# Patient Record
Sex: Female | Born: 1975 | Race: White | Hispanic: No | Marital: Married | Smoking: Never smoker
Health system: Southern US, Community
[De-identification: ages and names within clinical notes are randomized; demographics above are authoritative.]

## PROBLEM LIST (undated history)

## (undated) ENCOUNTER — Inpatient Hospital Stay: Admission: EM | Payer: Self-pay | Source: Home / Self Care

## (undated) DIAGNOSIS — E039 Hypothyroidism, unspecified: Secondary | ICD-10-CM

## (undated) DIAGNOSIS — O24419 Gestational diabetes mellitus in pregnancy, unspecified control: Secondary | ICD-10-CM

## (undated) DIAGNOSIS — F419 Anxiety disorder, unspecified: Secondary | ICD-10-CM

## (undated) DIAGNOSIS — D649 Anemia, unspecified: Secondary | ICD-10-CM

## (undated) HISTORY — PX: ABDOMINAL HYSTERECTOMY: SHX81

## (undated) HISTORY — PX: WISDOM TOOTH EXTRACTION: SHX21

## (undated) HISTORY — DX: Anemia, unspecified: D64.9

---

## 2001-03-15 ENCOUNTER — Other Ambulatory Visit: Admission: RE | Admit: 2001-03-15 | Discharge: 2001-03-15 | Payer: Self-pay | Admitting: Obstetrics and Gynecology

## 2001-12-01 ENCOUNTER — Other Ambulatory Visit: Admission: RE | Admit: 2001-12-01 | Discharge: 2001-12-01 | Payer: Self-pay | Admitting: Obstetrics and Gynecology

## 2002-07-11 ENCOUNTER — Inpatient Hospital Stay (HOSPITAL_COMMUNITY): Admission: RE | Admit: 2002-07-11 | Discharge: 2002-07-14 | Payer: Self-pay | Admitting: Obstetrics and Gynecology

## 2007-08-15 ENCOUNTER — Other Ambulatory Visit: Admission: RE | Admit: 2007-08-15 | Discharge: 2007-08-15 | Payer: Self-pay | Admitting: Obstetrics and Gynecology

## 2008-07-12 ENCOUNTER — Ambulatory Visit (HOSPITAL_COMMUNITY): Admission: RE | Admit: 2008-07-12 | Discharge: 2008-07-12 | Payer: Self-pay | Admitting: Family Medicine

## 2008-08-21 ENCOUNTER — Other Ambulatory Visit: Admission: RE | Admit: 2008-08-21 | Discharge: 2008-08-21 | Payer: Self-pay | Admitting: Obstetrics and Gynecology

## 2009-08-26 ENCOUNTER — Other Ambulatory Visit: Admission: RE | Admit: 2009-08-26 | Discharge: 2009-08-26 | Payer: Self-pay | Admitting: Obstetrics and Gynecology

## 2010-09-29 ENCOUNTER — Other Ambulatory Visit: Admission: RE | Admit: 2010-09-29 | Discharge: 2010-09-29 | Payer: Self-pay | Admitting: Obstetrics and Gynecology

## 2011-04-10 NOTE — Op Note (Signed)
   NAME:  Crystal Wiley, Crystal Wiley                          ACCOUNT NO.:  [id number]   MEDICAL RECORD NO.:  [id number]                   PATIENT TYPE:  OIB   LOCATION:  A417                                 FACILITY:  [facility]   PHYSICIAN:  Tilda Burrow, M.D.              DATE OF BIRTH:  [birth year]   DATE OF PROCEDURE:  DATE OF DISCHARGE:                                  PROCEDURE NOTE   DELIVERY NOTE:  The patient was noted to be fully dilated at approximately  0345 with complaints of rectal pressure.  She then began to push and had an  effective second stage.  She delivered a viable female infant at 91  spontaneously.  Mouth and nose were suctioned on the perineum, and the body  delivered easily with a compound right hand.  Apgars were 9 and 9, weight 6  pounds 5 ounces.  The placenta separated spontaneously and was delivered by  a maternal pushing effort and controlled cord traction at 0451.  It was  inspected and appeared to be intact.  The fundus was immediately firm with  very little blood loss.  The vagina was then inspected and a small first  degree laceration was noted, not necessitating repair.  The epidural  catheter was then removed with the blue tip visualized as being intact.  The  mother tolerated the delivery very well.  Estimated blood loss 300 cc.     Crystal Wiley, CNM             Tilda Burrow, M.D.    FC/MEDQ  D:  07/12/2002  T:  07/12/2002  Job:  [id number]

## 2011-04-10 NOTE — H&P (Signed)
NAME:  Crystal Wiley, KHURANA                          ACCOUNT NO.:  [id number]   MEDICAL RECORD NO.:  [id number]                   PATIENT TYPE:  OIB   LOCATION:  A417                                 FACILITY:  [facility]   PHYSICIAN:  Jacklyn Shell, CNM       DATE OF BIRTH:  Jan 20, 1976   DATE OF ADMISSION:  07/11/2002  DATE OF DISCHARGE:                                HISTORY & PHYSICAL   CHIEF COMPLAINT:  Regular uterine contractions since 2100 and spontaneous  rupture of membranes at 2300.   HISTORY OF PRESENT ILLNESS:  The patient is a 35 year old gravida 2, para 0-  0-1-0, with an EDC of July 16, 2002, based on a last menstrual period and  correlating exactly first and second trimester ultrasounds.  She began  prenatal care early in her first trimester and has had regular visits since.   PRENATAL LABORATORY DATA:  Blood type A-negative; she received RhoGAM on  May 02, 2002.  Rubella immune.  HBsAg, HIV, gonorrhea, Chlamydia, MSAFP and  GBS are all negative.  One-hour GTT was normal at 102.  Blood pressures have  been 110-120s/60s-80s.  Fundal height growth has been appropriate for  gestational age.   PAST MEDICAL HISTORY:  Benign.   PAST SURGICAL HISTORY:  Noncontributory.   No history of blood transfusions or anesthesia complications.   ALLERGIES:  No known drug allergies.   SOCIAL HISTORY:  Married.  Works at [employer] and had been working up  until today.   FAMILY HISTORY:  Family history positive for diabetes in a paternal  grandmother.   OBSTETRICAL HISTORY:  Spontaneous Ab, March 1998.   PHYSICAL EXAMINATION:  HEENT:  Within normal limits.  HEART:  Regular rate and rhythm.  LUNGS:  Clear.  ABDOMEN:  Soft and nontender, having regular, moderate-to-palpation uterine  contractions every two to three minutes.  PELVIC:  Cervix is now 5 cm, 100% effaced at 0 station, which is a change  from admission of 3 to 4, 80% and -2 per R.N.  EXTREMITIES:  Legs have  a very trace amount of edema.  DTRs are 3+ without  clonus.  VITAL SIGNS:  Blood pressures now are 140s-150s/high-90s-110s.   IMPRESSION:  Intrauterine pregnancy at 39-1/2 weeks, spontaneous labor with  spontaneous rupture of membranes, active stage, elevated blood pressure,  reassuring fetal status.   PLAN:  The patient is planning to receive an epidural momentarily.  We will  obtain a cath urine specimen once that is placed to rule out proteinuria and  if blood pressures continue to be elevated despite epidural anesthesia, we  will proceed with IV antihypertensives.  Dr. Tilda Burrow is aware of  the admission and will place the epidural.  Jacklyn Shell, CNM    FC/MEDQ  D:  07/12/2002  T:  07/12/2002  Job:  [id number]   cc:   [facility]

## 2012-11-23 NOTE — L&D Delivery Note (Signed)
Delivery Note At 5:30 AM a  viable and healthy female was delivered via Vaginal, Spontaneous Delivery (Presentation: OA).  APGAR:9, 9; weight- pending  Placenta status: spontaneous, intact. Cord: 3 vessel cord. No complications. Cord pH: N/A  Anesthesia: Epidural  Episiotomy: None Lacerations: one supraurethral superficial mucosal laceration, does not need repair Est. Blood Loss (mL): 250 cc  Mom to postpartum.  Baby to Couplet care / Skin to Skin.  Itsel Opfer R 11/18/2013, 5:50 AM

## 2013-04-12 LAB — OB RESULTS CONSOLE ABO/RH: RH Type: NEGATIVE

## 2013-04-12 LAB — OB RESULTS CONSOLE RPR: RPR: NONREACTIVE

## 2013-04-12 LAB — OB RESULTS CONSOLE HIV ANTIBODY (ROUTINE TESTING): HIV: NONREACTIVE

## 2013-04-12 LAB — OB RESULTS CONSOLE HEPATITIS B SURFACE ANTIGEN: Hepatitis B Surface Ag: NEGATIVE

## 2013-04-12 LAB — OB RESULTS CONSOLE RUBELLA ANTIBODY, IGM: Rubella: UNDETERMINED

## 2013-04-12 LAB — OB RESULTS CONSOLE ANTIBODY SCREEN: Antibody Screen: NEGATIVE

## 2013-04-12 LAB — OB RESULTS CONSOLE GBS: GBS: POSITIVE

## 2013-05-08 LAB — OB RESULTS CONSOLE GC/CHLAMYDIA
Chlamydia: NEGATIVE
Gonorrhea: NEGATIVE

## 2013-09-13 ENCOUNTER — Ambulatory Visit: Payer: Self-pay

## 2013-11-07 ENCOUNTER — Telehealth (HOSPITAL_COMMUNITY): Payer: Self-pay | Admitting: *Deleted

## 2013-11-07 ENCOUNTER — Other Ambulatory Visit: Payer: Self-pay | Admitting: Obstetrics

## 2013-11-07 ENCOUNTER — Encounter (HOSPITAL_COMMUNITY): Payer: Self-pay | Admitting: *Deleted

## 2013-11-07 NOTE — Telephone Encounter (Signed)
Preadmission screen  

## 2013-11-18 ENCOUNTER — Inpatient Hospital Stay (HOSPITAL_COMMUNITY)
Admission: AD | Admit: 2013-11-18 | Discharge: 2013-11-20 | DRG: 775 | Disposition: A | Discharge status: Home / Self Care | Payer: PRIVATE HEALTH INSURANCE | Source: Ambulatory Visit | Attending: Obstetrics & Gynecology | Admitting: Obstetrics & Gynecology

## 2013-11-18 ENCOUNTER — Inpatient Hospital Stay (HOSPITAL_COMMUNITY): Payer: PRIVATE HEALTH INSURANCE | Admitting: Anesthesiology

## 2013-11-18 ENCOUNTER — Encounter (HOSPITAL_COMMUNITY): Payer: PRIVATE HEALTH INSURANCE | Admitting: Anesthesiology

## 2013-11-18 ENCOUNTER — Encounter (HOSPITAL_COMMUNITY): Payer: Self-pay | Admitting: *Deleted

## 2013-11-18 DIAGNOSIS — IMO0001 Reserved for inherently not codable concepts without codable children: Secondary | ICD-10-CM

## 2013-11-18 DIAGNOSIS — O99814 Abnormal glucose complicating childbirth: Principal | ICD-10-CM | POA: Diagnosis present

## 2013-11-18 DIAGNOSIS — O09529 Supervision of elderly multigravida, unspecified trimester: Secondary | ICD-10-CM | POA: Diagnosis present

## 2013-11-18 DIAGNOSIS — O99892 Other specified diseases and conditions complicating childbirth: Secondary | ICD-10-CM | POA: Diagnosis present

## 2013-11-18 DIAGNOSIS — O360990 Maternal care for other rhesus isoimmunization, unspecified trimester, not applicable or unspecified: Secondary | ICD-10-CM | POA: Diagnosis present

## 2013-11-18 DIAGNOSIS — Z22330 Carrier of Group B streptococcus: Secondary | ICD-10-CM

## 2013-11-18 HISTORY — DX: Gestational diabetes mellitus in pregnancy, unspecified control: O24.419

## 2013-11-18 LAB — TYPE AND SCREEN
ABO/RH(D): A NEG
Antibody Screen: POSITIVE
DAT, IgG: NEGATIVE

## 2013-11-18 LAB — CBC
HCT: 36.2 % (ref 36.0–46.0)
Hemoglobin: 12.9 g/dL (ref 12.0–15.0)
MCH: 30.5 pg (ref 26.0–34.0)
MCHC: 35.6 g/dL (ref 30.0–36.0)
MCV: 85.6 fL (ref 78.0–100.0)
Platelets: 207 10*3/uL (ref 150–400)
RBC: 4.23 MIL/uL (ref 3.87–5.11)
RDW: 13.4 % (ref 11.5–15.5)
WBC: 14 10*3/uL — ABNORMAL HIGH (ref 4.0–10.5)

## 2013-11-18 LAB — ABO/RH: ABO/RH(D): A NEG

## 2013-11-18 LAB — RPR: RPR Ser Ql: NONREACTIVE

## 2013-11-18 MED ORDER — EPHEDRINE 5 MG/ML INJ
10.0000 mg | INTRAVENOUS | Status: DC | PRN
  Filled 2013-11-18: qty 4
  Filled 2013-11-18: qty 2

## 2013-11-18 MED ORDER — DIBUCAINE 1 % RE OINT: 1.0000 "application " | TOPICAL_OINTMENT | RECTAL | Status: DC | PRN

## 2013-11-18 MED ORDER — BENZOCAINE-MENTHOL 20-0.5 % EX AERO: 1.0000 "application " | INHALATION_SPRAY | CUTANEOUS | Status: DC | PRN

## 2013-11-18 MED ORDER — DIPHENHYDRAMINE HCL 25 MG PO CAPS: 25.0000 mg | ORAL_CAPSULE | Freq: Four times a day (QID) | ORAL | Status: DC | PRN

## 2013-11-18 MED ORDER — CITRIC ACID-SODIUM CITRATE 334-500 MG/5ML PO SOLN: 30.0000 mL | ORAL | Status: DC | PRN

## 2013-11-18 MED ORDER — LIDOCAINE HCL (PF) 1 % IJ SOLN
INTRAMUSCULAR | Status: DC | PRN
  Administered 2013-11-18: 3 mL
  Administered 2013-11-18 (×2): 5 mL

## 2013-11-18 MED ORDER — OXYTOCIN 40 UNITS IN LACTATED RINGERS INFUSION - SIMPLE MED
62.5000 mL/h | INTRAVENOUS | Status: DC
  Filled 2013-11-18: qty 1000

## 2013-11-18 MED ORDER — SIMETHICONE 80 MG PO CHEW: 80.0000 mg | CHEWABLE_TABLET | ORAL | Status: DC | PRN

## 2013-11-18 MED ORDER — IBUPROFEN 600 MG PO TABS
600.0000 mg | ORAL_TABLET | Freq: Four times a day (QID) | ORAL | Status: DC
  Administered 2013-11-18 – 2013-11-20 (×9): 600 mg via ORAL
  Filled 2013-11-18 (×9): qty 1

## 2013-11-18 MED ORDER — PRENATAL MULTIVITAMIN CH
1.0000 | ORAL_TABLET | Freq: Every day | ORAL | Status: DC
  Administered 2013-11-18 – 2013-11-20 (×3): 1 via ORAL
  Filled 2013-11-18 (×3): qty 1

## 2013-11-18 MED ORDER — FENTANYL 2.5 MCG/ML BUPIVACAINE 1/10 % EPIDURAL INFUSION (WH - ANES)
14.0000 mL/h | INTRAMUSCULAR | Status: DC | PRN
  Filled 2013-11-18: qty 125

## 2013-11-18 MED ORDER — FLEET ENEMA 7-19 GM/118ML RE ENEM: 1.0000 | ENEMA | RECTAL | Status: DC | PRN

## 2013-11-18 MED ORDER — ONDANSETRON HCL 4 MG PO TABS: 4.0000 mg | ORAL_TABLET | ORAL | Status: DC | PRN

## 2013-11-18 MED ORDER — OXYCODONE-ACETAMINOPHEN 5-325 MG PO TABS: 1.0000 | ORAL_TABLET | ORAL | Status: DC | PRN

## 2013-11-18 MED ORDER — WITCH HAZEL-GLYCERIN EX PADS: 1.0000 "application " | MEDICATED_PAD | CUTANEOUS | Status: DC | PRN

## 2013-11-18 MED ORDER — PENICILLIN G POTASSIUM 5000000 UNITS IJ SOLR
2.5000 10*6.[IU] | INTRAVENOUS | Status: DC
  Filled 2013-11-18 (×3): qty 2.5

## 2013-11-18 MED ORDER — SENNOSIDES-DOCUSATE SODIUM 8.6-50 MG PO TABS
2.0000 | ORAL_TABLET | ORAL | Status: DC
  Administered 2013-11-18 – 2013-11-20 (×2): 2 via ORAL
  Filled 2013-11-18 (×2): qty 2

## 2013-11-18 MED ORDER — ONDANSETRON HCL 4 MG/2ML IJ SOLN: 4.0000 mg | INTRAMUSCULAR | Status: DC | PRN

## 2013-11-18 MED ORDER — EPHEDRINE 5 MG/ML INJ
10.0000 mg | INTRAVENOUS | Status: DC | PRN
  Filled 2013-11-18: qty 2

## 2013-11-18 MED ORDER — IBUPROFEN 600 MG PO TABS: 600.0000 mg | ORAL_TABLET | Freq: Four times a day (QID) | ORAL | Status: DC | PRN

## 2013-11-18 MED ORDER — PHENYLEPHRINE 40 MCG/ML (10ML) SYRINGE FOR IV PUSH (FOR BLOOD PRESSURE SUPPORT)
80.0000 ug | PREFILLED_SYRINGE | INTRAVENOUS | Status: DC | PRN
  Filled 2013-11-18: qty 2

## 2013-11-18 MED ORDER — LACTATED RINGERS IV SOLN
INTRAVENOUS | Status: DC
  Administered 2013-11-18: 03:00:00 via INTRAVENOUS

## 2013-11-18 MED ORDER — LIDOCAINE HCL (PF) 1 % IJ SOLN
30.0000 mL | INTRAMUSCULAR | Status: DC | PRN
  Filled 2013-11-18 (×2): qty 30

## 2013-11-18 MED ORDER — FENTANYL 2.5 MCG/ML BUPIVACAINE 1/10 % EPIDURAL INFUSION (WH - ANES)
INTRAMUSCULAR | Status: DC | PRN
  Administered 2013-11-18: 14 mL/h via EPIDURAL

## 2013-11-18 MED ORDER — DIPHENHYDRAMINE HCL 50 MG/ML IJ SOLN: 12.5000 mg | INTRAMUSCULAR | Status: DC | PRN

## 2013-11-18 MED ORDER — LACTATED RINGERS IV SOLN
500.0000 mL | Freq: Once | INTRAVENOUS | Status: AC
  Administered 2013-11-18: 500 mL via INTRAVENOUS

## 2013-11-18 MED ORDER — LANOLIN HYDROUS EX OINT: TOPICAL_OINTMENT | CUTANEOUS | Status: DC | PRN

## 2013-11-18 MED ORDER — PENICILLIN G POTASSIUM 5000000 UNITS IJ SOLR
5.0000 10*6.[IU] | Freq: Once | INTRAVENOUS | Status: AC
  Administered 2013-11-18: 5 10*6.[IU] via INTRAVENOUS
  Filled 2013-11-18: qty 5

## 2013-11-18 MED ORDER — ONDANSETRON HCL 4 MG/2ML IJ SOLN
4.0000 mg | Freq: Four times a day (QID) | INTRAMUSCULAR | Status: DC | PRN
  Administered 2013-11-18: 4 mg via INTRAVENOUS
  Filled 2013-11-18: qty 2

## 2013-11-18 MED ORDER — LACTATED RINGERS IV SOLN: 500.0000 mL | INTRAVENOUS | Status: DC | PRN

## 2013-11-18 MED ORDER — ACETAMINOPHEN 325 MG PO TABS: 650.0000 mg | ORAL_TABLET | ORAL | Status: DC | PRN

## 2013-11-18 MED ORDER — OXYTOCIN BOLUS FROM INFUSION
500.0000 mL | INTRAVENOUS | Status: DC
  Administered 2013-11-18: 500 mL via INTRAVENOUS

## 2013-11-18 MED ORDER — PHENYLEPHRINE 40 MCG/ML (10ML) SYRINGE FOR IV PUSH (FOR BLOOD PRESSURE SUPPORT)
80.0000 ug | PREFILLED_SYRINGE | INTRAVENOUS | Status: DC | PRN
  Filled 2013-11-18: qty 2
  Filled 2013-11-18: qty 10

## 2013-11-18 MED ORDER — ZOLPIDEM TARTRATE 5 MG PO TABS: 5.0000 mg | ORAL_TABLET | Freq: Every evening | ORAL | Status: DC | PRN

## 2013-11-18 NOTE — Anesthesia Preprocedure Evaluation (Signed)
Anesthesia Evaluation  Patient identified by MRN, date of birth, ID band Patient awake    Reviewed: Allergy & Precautions, H&P , NPO status , Patient's Chart, lab work & pertinent test results  History of Anesthesia Complications Negative for: history of anesthetic complications  Airway Mallampati: II TM Distance: >3 FB Neck ROM: full    Dental no notable dental hx. (+) Teeth Intact   Pulmonary neg pulmonary ROS,  breath sounds clear to auscultation  Pulmonary exam normal       Cardiovascular negative cardio ROS  Rhythm:regular Rate:Normal     Neuro/Psych negative neurological ROS  negative psych ROS   GI/Hepatic negative GI ROS, Neg liver ROS,   Endo/Other  negative endocrine ROSdiabetes, Gestational  Renal/GU negative Renal ROS  negative genitourinary   Musculoskeletal   Abdominal Normal abdominal exam  (+)   Peds  Hematology negative hematology ROS (+)   Anesthesia Other Findings   Reproductive/Obstetrics (+) Pregnancy                           Anesthesia Physical Anesthesia Plan  ASA: II  Anesthesia Plan: Epidural   Post-op Pain Management:    Induction:   Airway Management Planned:   Additional Equipment:   Intra-op Plan:   Post-operative Plan:   Informed Consent: I have reviewed the patients History and Physical, chart, labs and discussed the procedure including the risks, benefits and alternatives for the proposed anesthesia with the patient or authorized representative who has indicated his/her understanding and acceptance.     Plan Discussed with:   Anesthesia Plan Comments:         Anesthesia Quick Evaluation  

## 2013-11-18 NOTE — Progress Notes (Signed)
Patient ID: Crystal Wiley, female   DOB: 1976-04-21, 37 y.o.   MRN: [id number] INTERVAL NOTE:  S:   Sitting in bed, BFing, min cramping, (+) voids, small bleed, denies HA/NV/dizziness  O:   VSS, AAO x 3, NAD  Fundus @ U  Scant lochia  A / P:   PPD #0  Stable post partum  Routine PP orders  Desires early discharge tomorrow  Raelyn Mora, M, MSN, CNM 11/18/2013, 10:40 AM

## 2013-11-18 NOTE — Anesthesia Procedure Notes (Signed)
Epidural Patient location during procedure: OB  Staffing Anesthesiologist: Jeremiah Tarpley Performed by: anesthesiologist   Preanesthetic Checklist Completed: patient identified, site marked, surgical consent, pre-op evaluation, timeout performed, IV checked, risks and benefits discussed and monitors and equipment checked  Epidural Patient position: sitting Prep: ChloraPrep Patient monitoring: heart rate, continuous pulse ox and blood pressure Approach: right paramedian Injection technique: LOR saline  Needle:  Needle type: Tuohy  Needle gauge: 17 G Needle length: 9 cm and 9 Needle insertion depth: 6 cm Catheter type: closed end flexible Catheter size: 20 Guage Catheter at skin depth: 11 cm Test dose: negative  Assessment Events: blood not aspirated, injection not painful, no injection resistance, negative IV test and no paresthesia  Additional Notes   Patient tolerated the insertion well without complications.   

## 2013-11-18 NOTE — MAU Note (Signed)
Patient reports contractions every 5-7 minutes since 10 pm and some mucous mixed with blood. Good fetal movement.

## 2013-11-18 NOTE — H&P (Signed)
Crystal Wiley is a 37 y.o. female presenting in active labor at 39.5 wks. G3P1011, AMA, spontaneous pregnancy, GBS, A1GDM with good BS control, Rh negative [facility] [place], Dr Algie Coffer. Uncomplicated course except 1hr Glucola was 211, since then checking diet controlled with normal BS. Took TDap.   Maternal Medical History:  Reason for admission: Contractions.     OB History   Grav Para Term Preterm Abortions TAB SAB Ect Mult Living   3 1 1  1  1   1      Past Medical History  Diagnosis Date  . Gestational diabetes     Diet controlled   Past Surgical History  Procedure Laterality Date  . Wisdom tooth extraction     Family History: family history includes Cancer in her paternal aunt and paternal grandmother; Diabetes in her paternal grandmother. Social History:  reports that she has never smoked. She has never used smokeless tobacco. She reports that she does not drink alcohol or use illicit drugs.   Prenatal Transfer Tool  Maternal Diabetes: Yes:  Diabetes Type:  Diet controlled Genetic Screening: Normal Ultrascreen and AFP1 normal Maternal Ultrasounds/Referrals: Normal Fetal Ultrasounds or other Referrals:  None Maternal Substance Abuse:  No Significant Maternal Medications:  None Significant Maternal Lab Results:  Lab values include: Group B Strep positive, Rh negative, s/p Rhogam Other Comments:  None  ROS neg  Dilation: 8 Effacement (%): 90 Station: 0 Exam by:: Crystal Wiley Blood pressure 127/76, pulse 122, temperature 99 F (37.2 C), temperature source Oral, resp. rate 20, height 5\' 3"  (1.6 m), weight 190 lb (86.183 kg), last menstrual period 02/13/2013, SpO2 100.00%. Exam Physical Exam   A&O x 3, no acute distress. Pleasant HEENT neg, no thyromegaly Lungs CTA bilat CV RRR, S1S2 normal Abdo soft, non tender, non acute Extr no edema/ tenderness Pelvic - above FHT 135/ + accels./ no decels/ mod variab- reassuring- category I Toco q 5 min.   Prenatal labs: ABO,  Rh: A/Negative/-- (05/21 0000) Antibody: Negative (05/21 0000) Rubella: Equivocal (05/21 0000) RPR: Nonreactive (05/21 0000)  HBsAg: Negative (05/21 0000)  HIV: Non-reactive (05/21 0000)  GBS: Positive (05/21 0000)  Anatomy normal Ultrascreen neg, AFP 1 normal  Assessment/Plan: 37 yo, G3P1011, at 39.5 wks with active labor. GBS(+), on PCN per protocol and s/p Epidural, progressing well. FHT category I  Crystal Wiley 11/18/2013, 4:36 AM

## 2013-11-18 NOTE — Anesthesia Postprocedure Evaluation (Signed)
Anesthesia Post Note  Patient: Crystal Wiley  Procedure(s) Performed: * No procedures listed *  Anesthesia type: Epidural  Patient location: Mother/Baby  Post pain: Pain level controlled  Post assessment: Post-op Vital signs reviewed  Last Vitals:  Filed Vitals:   11/18/13 1747  BP: 145/80  Pulse: 85  Temp: 36.4 C  Resp: 20    Post vital signs: Reviewed  Level of consciousness:alert  Complications: No apparent anesthesia complications

## 2013-11-19 LAB — CBC
HCT: 32.7 % — ABNORMAL LOW (ref 36.0–46.0)
Hemoglobin: 11.1 g/dL — ABNORMAL LOW (ref 12.0–15.0)
MCH: 29.4 pg (ref 26.0–34.0)
MCHC: 33.9 g/dL (ref 30.0–36.0)
MCV: 86.5 fL (ref 78.0–100.0)
Platelets: 171 10*3/uL (ref 150–400)
RBC: 3.78 MIL/uL — ABNORMAL LOW (ref 3.87–5.11)
RDW: 13.8 % (ref 11.5–15.5)
WBC: 10.9 10*3/uL — ABNORMAL HIGH (ref 4.0–10.5)

## 2013-11-19 MED ORDER — MEASLES, MUMPS & RUBELLA VAC ~~LOC~~ INJ: 0.5000 mL | INJECTION | Freq: Once | SUBCUTANEOUS | Status: DC

## 2013-11-19 NOTE — Plan of Care (Signed)
Problem: Discharge Progression Outcomes Goal: MMR given as ordered Outcome: Not Met (add Reason) Pt refused rubella vaccine when educated about non-immune status and possibility of getting [place] measles with any further pregnancies, that this can harm the baby.

## 2013-11-19 NOTE — Progress Notes (Signed)
Pt is rubella non-immune was offerred Rubella vaccine, refused vaccine. Was educated as to risks of contracting rubella when pregnant as to the harm it will cause the baby.

## 2013-11-19 NOTE — Progress Notes (Addendum)
Mother found asleep  And holding infant. Mother had been told earlier in shift to not sleep with infant due to possibly injuring infant. Mother verbalized understanding information given. Mother also requested infant testing be delayed to allow infant time to "wake up".

## 2013-11-19 NOTE — Progress Notes (Signed)
Patient ID: Crystal Wiley, female   DOB: [birth year], 37 y.o.   MRN: [id number] PPD # 1 SVD  S:  Reports feeling well             Tolerating po/ No nausea or vomiting             Bleeding is light             Pain controlled with ibuprofen (OTC)             Up ad lib / ambulatory / voiding without difficulties    Newborn  Information for the patient's newborn:  Jaina, Morin [[id number]]  female  breast feeding  / Circumcision done   O:  A & O x 3, in no apparent distress              VS:  Filed Vitals:   11/18/13 0900 11/18/13 1300 11/18/13 1747 11/19/13 0540  BP: 131/83 121/84 145/80 132/87  Pulse: 85 82 85 91  Temp: 97.7 F (36.5 C) 97.4 F (36.3 C) 97.5 F (36.4 C) 98.5 F (36.9 C)  TempSrc: Oral Oral Oral Oral  Resp: 18 18 20 18   Height:      Weight:      SpO2:        LABS:  Recent Labs  11/18/13 0227 11/19/13 0549  WBC 14.0* 10.9*  HGB 12.9 11.1*  HCT 36.2 32.7*  PLT 207 171    Blood type: --/--/A NEG, A NEG (12/27 0227)  Rubella: Equivocal (05/21 0000)   I&O: I/O last 3 completed shifts: In: -  Out: 1100 [Urine:850; Blood:250]             Lungs: Clear and unlabored  Heart: regular rate and rhythm / no murmurs  Abdomen: soft, non-tender, non-distended              Fundus: firm, non-tender, U-2  Perineum: healing well  Lochia: minimal  Extremities: no edema, no calf pain or tenderness, no Homans    A/P: PPD # 1  37 y.o., [id number]   Principal Problem:   SVD (spontaneous vaginal delivery) (12/27)   Doing well - stable status  Routine post partum orders  Anticipate discharge tomorrow    Raelyn Mora, M, MSN, CNM 11/19/2013, 10:55 AM

## 2013-11-20 ENCOUNTER — Inpatient Hospital Stay (HOSPITAL_COMMUNITY): Admission: RE | Admit: 2013-11-20 | Payer: PRIVATE HEALTH INSURANCE | Source: Ambulatory Visit

## 2013-11-20 MED ORDER — IBUPROFEN 600 MG PO TABS: 600.0000 mg | ORAL_TABLET | Freq: Four times a day (QID) | ORAL | Status: DC

## 2013-11-20 NOTE — Discharge Summary (Signed)
Obstetric Discharge Summary  Reason for Admission: onset of labor- active labor / 39.5 weeks with GDMa1 Prenatal Procedures: ultrasound Intrapartum Procedures: GBS prophylaxis with PCN / epidural /  spontaneous vaginal delivery Postpartum Procedures: none -Rhogam at discharge / declined MMR booster Complications-Operative and Postpartum: none Hemoglobin  Date Value Range Status  11/19/2013 11.1* 12.0 - 15.0 g/dL Final     HCT  Date Value Range Status  11/19/2013 32.7* 36.0 - 46.0 % Final    Physical Exam:  General: alert, cooperative and no distress Lochia: appropriate Uterine Fundus: firm DVT Evaluation: No evidence of DVT seen on physical exam.  Discharge Diagnoses: Term Pregnancy-delivered / GDMa1 - delivered  Discharge Information: Date: 11/20/2013 Activity: pelvic rest Diet: routine Medications: PNV and Ibuprofen Condition: stable Instructions: refer to practice specific booklet Discharge to: home Follow-up Information   Follow up with [facility] A[facility] MD. Schedule an appointment as soon as possible for a visit in 6 weeks.   Specialty:  Obstetrics and Gynecology   Contact information:   [address] [place] [address] [phone]       Newborn Data: Live born female  Birth Weight: 7 lb 8.4 oz (3413 g) APGAR: 9, 9  Home with mother.  Crystal Wiley 11/20/2013, 10:12 AM

## 2013-11-20 NOTE — Progress Notes (Signed)
PPD 2 SVD  S:  Reports feeling well - ready to go home             Tolerating po/ No nausea or vomiting             Bleeding is light             Pain controlled with motrin only             Up ad lib / ambulatory / voiding QS  Newborn breast feeding  / circumcision planned prior to DC - awaiting Peds visit this am as well  O:               VS: BP 125/89  Pulse 89  Temp(Src) 98.6 F (37 C) (Oral)  Resp 18  Ht 5\' 3"  (1.6 m)  Wt 86.183 kg (190 lb)  BMI 33.67 kg/m2  SpO2 98%  LMP 02/13/2013   LABS:              Recent Labs  11/18/13 0227 11/19/13 0549  WBC 14.0* 10.9*  HGB 12.9 11.1*  PLT 207 171               Blood type: --/--/A NEG, A NEG (12/27 0227)  Rubella: Equivocal (05/21 0000)      - declined MMR booster (flag office chart)                  Physical Exam:             Alert and oriented X3  Abdomen: soft, non-tender, non-distended              Fundus: firm, non-tender, U-1  Perineum: no edema  Lochia: light  Extremities: no edema, no calf pain or tenderness    A: PPD # 2   Doing well - stable status  P: Routine post partum orders  Dc home today   Marlinda Mike CNM, MSN, [facility] 11/20/2013, 9:38 AM

## 2014-04-18 ENCOUNTER — Encounter: Payer: Self-pay | Admitting: Nurse Practitioner

## 2014-04-18 ENCOUNTER — Ambulatory Visit (INDEPENDENT_AMBULATORY_CARE_PROVIDER_SITE_OTHER): Payer: PRIVATE HEALTH INSURANCE | Admitting: Nurse Practitioner

## 2014-04-18 VITALS — BP 118/82 | Temp 100.0°F | Ht 63.0 in | Wt 184.6 lb

## 2014-04-18 DIAGNOSIS — B348 Other viral infections of unspecified site: Secondary | ICD-10-CM

## 2014-04-18 DIAGNOSIS — B338 Other specified viral diseases: Secondary | ICD-10-CM

## 2014-04-18 MED ORDER — AZITHROMYCIN 250 MG PO TABS: ORAL_TABLET | ORAL | Status: DC

## 2014-04-18 MED ORDER — ALBUTEROL SULFATE HFA 108 (90 BASE) MCG/ACT IN AERS: 2.0000 | INHALATION_SPRAY | RESPIRATORY_TRACT | Status: DC | PRN

## 2014-04-21 ENCOUNTER — Encounter: Payer: Self-pay | Admitting: Nurse Practitioner

## 2014-04-21 NOTE — Progress Notes (Signed)
Subjective:  Presents for c/o fever, fatigue, body aches, generalized headache, cough, head congestion and sore throat that began yesterday. Max temp 100.9. No vomiting, diarrhea or abd pain. No ear pain. Taking fluids well. Voiding nl. Is NOT breastfeeding.  Objective:   BP 118/82  Temp(Src) 100 F (37.8 C) (Oral)  Ht 5\' 3"  (1.6 m)  Wt 184 lb 9.6 oz (83.734 kg)  BMI 32.71 kg/m2 NAD. Alert, oriented. Fatigued in appearance. TMs clear effusion. Pharynx clear and moist. Neck supple with mild adenopathy. Lungs clear. Heart RRR.   Assessment: Parainfluenza  Plan:  Meds ordered this encounter  Medications  . azithromycin (ZITHROMAX Z-PAK) 250 MG tablet    Sig: Take 2 tablets (500 mg) on  Day 1,  followed by 1 tablet (250 mg) once daily on Days 2 through 5.    Dispense:  6 each    Refill:  0    Order Specific Question:  Supervising Provider    Answer:  Merlyn Albert [[id number]]  . albuterol (PROVENTIL HFA;VENTOLIN HFA) 108 (90 BASE) MCG/ACT inhaler    Sig: Inhale 2 puffs into the lungs every 4 (four) hours as needed for wheezing or shortness of breath.    Dispense:  1 Inhaler    Refill:  0    Order Specific Question:  Supervising Provider    Answer:  Riccardo Dubin   Start antibiotic in 2-3 days if needed. Call back if wheezing worsens or persists. Warning signs and symptomatic care reviewed.

## 2014-09-24 ENCOUNTER — Encounter: Payer: Self-pay | Admitting: Nurse Practitioner

## 2015-02-28 ENCOUNTER — Other Ambulatory Visit (HOSPITAL_COMMUNITY): Payer: Self-pay | Admitting: "Endocrinology

## 2015-02-28 DIAGNOSIS — E049 Nontoxic goiter, unspecified: Secondary | ICD-10-CM

## 2015-03-07 ENCOUNTER — Ambulatory Visit (HOSPITAL_COMMUNITY)
Admission: RE | Admit: 2015-03-07 | Discharge: 2015-03-07 | Disposition: A | Discharge status: Home / Self Care | Payer: Commercial Managed Care - PPO | Source: Ambulatory Visit | Attending: "Endocrinology | Admitting: "Endocrinology

## 2015-03-07 DIAGNOSIS — E039 Hypothyroidism, unspecified: Secondary | ICD-10-CM | POA: Diagnosis not present

## 2015-03-07 DIAGNOSIS — E049 Nontoxic goiter, unspecified: Secondary | ICD-10-CM

## 2015-08-21 ENCOUNTER — Other Ambulatory Visit: Payer: Self-pay | Admitting: "Endocrinology

## 2015-09-02 LAB — TSH
TSH: 0.31 u[IU]/mL — AB (ref <=5.90)
TSH: 0.31 u[IU]/mL — AB (ref <=5.90)

## 2015-09-02 LAB — HEMOGLOBIN A1C: Hgb A1c MFr Bld: 6 % (ref 4.0–6.0)

## 2015-09-10 ENCOUNTER — Ambulatory Visit (INDEPENDENT_AMBULATORY_CARE_PROVIDER_SITE_OTHER): Payer: Commercial Managed Care - PPO | Admitting: "Endocrinology

## 2015-09-10 ENCOUNTER — Encounter: Payer: Self-pay | Admitting: "Endocrinology

## 2015-09-10 VITALS — BP 110/81 | HR 78 | Ht 63.0 in | Wt 176.0 lb

## 2015-09-10 DIAGNOSIS — E039 Hypothyroidism, unspecified: Secondary | ICD-10-CM | POA: Diagnosis not present

## 2015-09-10 DIAGNOSIS — E6609 Other obesity due to excess calories: Secondary | ICD-10-CM | POA: Diagnosis not present

## 2015-09-10 DIAGNOSIS — R7302 Impaired glucose tolerance (oral): Secondary | ICD-10-CM | POA: Diagnosis not present

## 2015-09-10 DIAGNOSIS — R7309 Other abnormal glucose: Secondary | ICD-10-CM

## 2015-09-10 DIAGNOSIS — R7303 Prediabetes: Secondary | ICD-10-CM | POA: Insufficient documentation

## 2015-09-10 MED ORDER — LEVOTHYROXINE SODIUM 88 MCG PO TABS: 88.0000 ug | ORAL_TABLET | Freq: Every day | ORAL | Status: DC

## 2015-09-10 MED ORDER — PHENTERMINE-TOPIRAMATE ER 7.5-46 MG PO CP24: 1.0000 | ORAL_CAPSULE | Freq: Every day | ORAL | Status: DC

## 2015-09-10 MED ORDER — METFORMIN HCL 500 MG PO TABS: 500.0000 mg | ORAL_TABLET | Freq: Two times a day (BID) | ORAL | Status: DC

## 2015-09-10 NOTE — Progress Notes (Addendum)
Subjective:    Patient ID: Crystal Wiley, female    DOB: 1976/02/16,    Past Medical History  Diagnosis Date  . Gestational diabetes     Diet controlled  . SVD (spontaneous vaginal delivery) 11/18/2013  . SVD (spontaneous vaginal delivery) (12/27) 11/18/2013   Past Surgical History  Procedure Laterality Date  . Wisdom tooth extraction     Social History   Social History  . Marital Status: Married    Spouse Name: N/A  . Number of Children: N/A  . Years of Education: N/A   Social History Main Topics  . Smoking status: Never Smoker   . Smokeless tobacco: Never Used  . Alcohol Use: No  . Drug Use: No  . Sexual Activity: Yes   Other Topics Concern  . None   Social History Narrative   Outpatient Encounter Prescriptions as of 09/10/2015  Medication Sig  . levothyroxine (SYNTHROID, LEVOTHROID) 88 MCG tablet Take 1 tablet (88 mcg total) by mouth daily before breakfast.  . metFORMIN (GLUCOPHAGE) 500 MG tablet Take 1 tablet (500 mg total) by mouth 2 (two) times daily with a meal.  . Phentermine-Topiramate (QSYMIA) 7.5-46 MG CP24 Take 1 capsule by mouth daily.  . [DISCONTINUED] levothyroxine (SYNTHROID, LEVOTHROID) 75 MCG tablet Take 75 mcg by mouth daily before breakfast.  . [DISCONTINUED] metFORMIN (GLUCOPHAGE) 500 MG tablet Take by mouth 2 (two) times daily with a meal.  . [DISCONTINUED] Phentermine-Topiramate (QSYMIA) 7.5-46 MG CP24 Take by mouth daily.  . [DISCONTINUED] albuterol (PROVENTIL HFA;VENTOLIN HFA) 108 (90 BASE) MCG/ACT inhaler Inhale 2 puffs into the lungs every 4 (four) hours as needed for wheezing or shortness of breath.  . [DISCONTINUED] azithromycin (ZITHROMAX Z-PAK) 250 MG tablet Take 2 tablets (500 mg) on  Day 1,  followed by 1 tablet (250 mg) once daily on Days 2 through 5.  . [DISCONTINUED] ibuprofen (ADVIL,MOTRIN) 600 MG tablet Take 1 tablet (600 mg total) by mouth every 6 (six) hours.  . [DISCONTINUED] levothyroxine (SYNTHROID, LEVOTHROID) 88 MCG  tablet TAKE ONE TABLET BY MOUTH ONCE DAILY IN THE MORNING.  . [DISCONTINUED] Prenatal Vit-Fe Fumarate-FA (PRENATAL MULTIVITAMIN) TABS tablet Take 1 tablet by mouth daily at 12 noon.   No facility-administered encounter medications on file as of 09/10/2015.   ALLERGIES: No Known Allergies VACCINATION STATUS: Immunization History  Administered Date(s) Administered  . Influenza Split 08/24/2013  . Tdap 08/24/2013    HPI  Crystal Wiley is a 39-yr -old female patient with medical hx a s above. she is here to f/u with repeat TFTs, and a1c. he denies dysphagia, SOB, nor voice change. She feels better, has more energy, lost 13 pounds. She denies cold intolerance heat intolerance. She has not been consistent with metformin. she was found to have clinical goiter and recent thyroid ultrasound confirmed mild goiter.  she has had hx of GDM about a year ago.  Review of Systems  Constitutional: no weight gain/loss, no fatigue, no subjective hyperthermia/hypothermia Eyes: no blurry vision, no xerophthalmia ENT: no sore throat, no nodules palpated in throat, no dysphagia/odynophagia, no hoarseness Cardiovascular: no CP/SOB/palpitations/leg swelling Respiratory: no cough/SOB Gastrointestinal: no N/V/D/C Musculoskeletal: no muscle/joint aches Skin: no rashes Neurological: no tremors/numbness/tingling/dizziness Psychiatric: no depression/anxiety   Objective:    BP 110/81 mmHg  Pulse 78  Ht  (1.6 m)  Wt 176 lb (79.833 kg)  BMI 31.18 kg/m2  SpO2 100%  Wt Readings from Last 3 Encounters:  09/10/15 176 lb (79.833 kg)  04/18/14 184 lb 9.6  oz (83.734 kg)  11/18/13 190 lb (86.183 kg)    Physical Exam  Constitutional: overweight, in NAD Eyes: PERRLA, EOMI, no exophthalmos ENT: moist mucous membranes, no thyromegaly, no cervical lymphadenopathy Cardiovascular: RRR, No MRG Respiratory: CTA B Gastrointestinal: abdomen soft, NT, ND, BS+ Musculoskeletal: no deformities, strength intact in  all 4 Skin: moist, warm, no rashes Neurological: no tremor with outstretched hands, DTR normal in all 4  Results for orders placed or performed in visit on 09/10/15  Hemoglobin A1c  Result Value Ref Range   Hgb A1c MFr Bld 6.0 4.0 - 6.0 %  TSH  Result Value Ref Range   TSH 0.31 (A) .41 - 5.90 uIU/mL  TSH  Result Value Ref Range   TSH 0.31 (A) .41 - 5.90 uIU/mL   associated free T4 is 1.43 from 09/02/2015 (normal 0.82-1.77) Complete Blood Count (Most recent): Lab Results  Component Value Date   WBC 10.9* 11/19/2013   HGB 11.1* 11/19/2013   HCT 32.7* 11/19/2013   MCV 86.5 11/19/2013   PLT 171 11/19/2013      Assessment & Plan:   1. Primary hypothyroidism She is clinically and biochemical euthyroid. I like to continue with levothyroxine 88 g by mouth every morning.  - We discussed about correct intake of levothyroxine, at fasting, with water, separated by at least 30 minutes from breakfast, and separated by more than 4 hours from calcium, iron, multivitamins, acid reflux medications (PPIs). -Patient is made aware of the fact that thyroid hormone replacement is needed for life, dose to be adjusted by periodic monitoring of thyroid function tests.  2. Impaired glucose tolerance test She has not been consistent on metformin intake. I advised her to continue metformin 500 mg by mouth twice a day. Her A1c is up to 6%. She has history of gestational diabetes, at least for type 2 diabetes.  3. Obesity due to excess calories She has lost 13 pounds on Qsymia. Nor reported side effects or adverse effects. I advised her to continue every qsymia 7.5/46 mg by mouth daily. I have counseled her on potential teratogenicity ofqsymia and advised her to use dependable contraceptive method to avoid pregnancy while on qsymia. She voices understanding.  I advised patient to maintain close follow up with their PCP for primary care needs. Follow up plan: Return in about 6 months (around 03/10/2016)  for underactive thyroid, PREDIABETES.  Marquis LunchGebre Shannel Zahm, MD Phone: [phone]  Fax: [phone]   09/10/2015, 9:11 AM

## 2015-09-10 NOTE — Patient Instructions (Signed)

## 2015-11-13 ENCOUNTER — Telehealth: Payer: Self-pay | Admitting: "Endocrinology

## 2015-11-13 NOTE — Telephone Encounter (Signed)
Pt said her qsymia was denied by insurance, can you send in a different script for something else. call back at [phone] before 4 please

## 2015-11-14 NOTE — Telephone Encounter (Signed)
We will have to discuss her other options during her next visit.

## 2015-11-14 NOTE — Telephone Encounter (Signed)
Pt.notified

## 2016-03-03 LAB — HEMOGLOBIN A1C
Hemoglobin A1C: 5.6
Hemoglobin A1C: 5.6

## 2016-03-03 LAB — TSH
TSH: 1.12 u[IU]/mL (ref <=5.90)
TSH: 1.12 u[IU]/mL (ref <=5.90)

## 2016-03-12 ENCOUNTER — Ambulatory Visit (INDEPENDENT_AMBULATORY_CARE_PROVIDER_SITE_OTHER): Payer: Commercial Managed Care - PPO | Admitting: "Endocrinology

## 2016-03-12 ENCOUNTER — Encounter: Payer: Self-pay | Admitting: "Endocrinology

## 2016-03-12 VITALS — BP 137/89 | HR 72 | Ht 63.0 in | Wt 183.0 lb

## 2016-03-12 DIAGNOSIS — E6609 Other obesity due to excess calories: Secondary | ICD-10-CM | POA: Diagnosis not present

## 2016-03-12 DIAGNOSIS — R7303 Prediabetes: Secondary | ICD-10-CM

## 2016-03-12 DIAGNOSIS — E039 Hypothyroidism, unspecified: Secondary | ICD-10-CM | POA: Diagnosis not present

## 2016-03-12 MED ORDER — METFORMIN HCL 500 MG PO TABS: 500.0000 mg | ORAL_TABLET | Freq: Two times a day (BID) | ORAL | Status: DC

## 2016-03-12 MED ORDER — LEVOTHYROXINE SODIUM 88 MCG PO TABS: 88.0000 ug | ORAL_TABLET | Freq: Every day | ORAL | Status: DC

## 2016-03-12 NOTE — Progress Notes (Signed)
Subjective:    Patient ID: Crystal Wiley, female    DOB: 03/06/1976,    Past Medical History  Diagnosis Date  . Gestational diabetes     Diet controlled  . SVD (spontaneous vaginal delivery) 11/18/2013  . SVD (spontaneous vaginal delivery) (12/27) 11/18/2013   Past Surgical History  Procedure Laterality Date  . Wisdom tooth extraction     Social History   Social History  . Marital Status: Married    Spouse Name: N/A  . Number of Children: N/A  . Years of Education: N/A   Social History Main Topics  . Smoking status: Never Smoker   . Smokeless tobacco: Never Used  . Alcohol Use: No  . Drug Use: No  . Sexual Activity: Yes   Other Topics Concern  . None   Social History Narrative   Outpatient Encounter Prescriptions as of 03/12/2016  Medication Sig  . levothyroxine (SYNTHROID, LEVOTHROID) 88 MCG tablet Take 1 tablet (88 mcg total) by mouth daily before breakfast.  . metFORMIN (GLUCOPHAGE) 500 MG tablet Take 1 tablet (500 mg total) by mouth 2 (two) times daily with a meal.  . [DISCONTINUED] levothyroxine (SYNTHROID, LEVOTHROID) 88 MCG tablet Take 1 tablet (88 mcg total) by mouth daily before breakfast.  . [DISCONTINUED] metFORMIN (GLUCOPHAGE) 500 MG tablet Take 1 tablet (500 mg total) by mouth 2 (two) times daily with a meal.  . [DISCONTINUED] Phentermine-Topiramate (QSYMIA) 7.5-46 MG CP24 Take 1 capsule by mouth daily.   No facility-administered encounter medications on file as of 03/12/2016.   ALLERGIES: No Known Allergies VACCINATION STATUS: Immunization History  Administered Date(s) Administered  . Influenza Split 08/24/2013  . Tdap 08/24/2013    HPI  Mrs. Crystal Wiley is a 40-yr -old female patient with medical hx as above. she is here to f/u with repeat TFTs, and a1c. - She denies new complaints. Her insurance did not cover the weight loss medication,qsymia.  - She denies cold intolerance heat intolerance. She has  been consistent with metformin , as he has  improved to 5.6%. she was found to have clinical goiter and recent thyroid ultrasound confirmed mild goiter.  she has had hx of GDM about a year ago.  Review of Systems  Constitutional: +weight gain of 6 pounds, no fatigue, no subjective hyperthermia/hypothermia Eyes: no blurry vision, no xerophthalmia ENT: no sore throat, no nodules palpated in throat, no dysphagia/odynophagia, no hoarseness Cardiovascular: no CP/SOB/palpitations/leg swelling Respiratory: no cough/SOB Gastrointestinal: no N/V/D/C Musculoskeletal: no muscle/joint aches Skin: no rashes Neurological: no tremors/numbness/tingling/dizziness Psychiatric: no depression/anxiety   Objective:    BP 137/89 mmHg  Pulse 72  Ht 5\' 3"  (1.6 m)  Wt 183 lb (83.008 kg)  BMI 32.43 kg/m2  SpO2 98%  Wt Readings from Last 3 Encounters:  03/12/16 183 lb (83.008 kg)  09/10/15 176 lb (79.833 kg)  04/18/14 184 lb 9.6 oz (83.734 kg)    Physical Exam  Constitutional: overweight, in NAD Eyes: PERRLA, EOMI, no exophthalmos ENT: moist mucous membranes, no thyromegaly, no cervical lymphadenopathy Cardiovascular: RRR, No MRG Respiratory: CTA B Gastrointestinal: abdomen soft, NT, ND, BS+ Musculoskeletal: no deformities, strength intact in all 4 Skin: moist, warm, no rashes Neurological: no tremor with outstretched hands, DTR normal in all 4  Results for orders placed or performed in visit on 03/12/16  Hemoglobin A1c  Result Value Ref Range   Hemoglobin A1C 5.6   TSH  Result Value Ref Range   TSH 1.12 .41 - 5.90 uIU/mL   associated free T4  is 1.43 from 09/02/2015 (normal 0.82-1.77) Complete Blood Count (Most recent): Lab Results  Component Value Date   WBC 10.9* 11/19/2013   HGB 11.1* 11/19/2013   HCT 32.7* 11/19/2013   MCV 86.5 11/19/2013   PLT 171 11/19/2013      Assessment & Plan:   1. Primary hypothyroidism Her labs included only TSH, added free T4 to be done with her next blood work. - I like to continue with  levothyroxine 88 g by mouth every morning.  - We discussed about correct intake of levothyroxine, at fasting, with water, separated by at least 30 minutes from breakfast, and separated by more than 4 hours from calcium, iron, multivitamins, acid reflux medications (PPIs). -Patient is made aware of the fact that thyroid hormone replacement is needed for life, dose to be adjusted by periodic monitoring of thyroid function tests.  2. Impaired glucose tolerance test I advised her to continue metformin 500 mg by mouth twice a day. Her A1c is better at 5. 6%. She has history of gestational diabetes, at least for type 2 diabetes.  3. Obesity due to excess calories Her insurance did not cover Qsymia . -She is advised to continue on modified diet low in carbs and appropriate in protein. She is advised to exercise 30 minutes 3-4 days a week.   I advised patient to maintain close follow up with their PCP for primary care needs. Follow up plan: Return in about 6 months (around 09/11/2016) for underactive thyroid, prediabetes, follow up with pre-visit labs.  Marquis Lunch, MD Phone: [phone]  Fax: [phone]   03/12/2016, 10:46 AM

## 2016-03-24 ENCOUNTER — Encounter: Payer: Self-pay | Admitting: "Endocrinology

## 2016-09-03 LAB — TSH: TSH: 1.69 u[IU]/mL (ref <=5.90)

## 2016-09-11 ENCOUNTER — Encounter: Payer: Self-pay | Admitting: "Endocrinology

## 2016-09-11 ENCOUNTER — Ambulatory Visit (INDEPENDENT_AMBULATORY_CARE_PROVIDER_SITE_OTHER): Payer: Commercial Managed Care - PPO | Admitting: "Endocrinology

## 2016-09-11 VITALS — BP 119/79 | HR 83 | Ht 63.0 in | Wt 178.0 lb

## 2016-09-11 DIAGNOSIS — E039 Hypothyroidism, unspecified: Secondary | ICD-10-CM

## 2016-09-11 DIAGNOSIS — Z6831 Body mass index (BMI) 31.0-31.9, adult: Secondary | ICD-10-CM | POA: Diagnosis not present

## 2016-09-11 DIAGNOSIS — E6609 Other obesity due to excess calories: Secondary | ICD-10-CM | POA: Diagnosis not present

## 2016-09-11 DIAGNOSIS — R7303 Prediabetes: Secondary | ICD-10-CM

## 2016-09-11 MED ORDER — LEVOTHYROXINE SODIUM 100 MCG PO TABS: 100.0000 ug | ORAL_TABLET | Freq: Every day | ORAL | 6 refills | Status: DC

## 2016-09-11 NOTE — Progress Notes (Signed)
Subjective:    Patient ID: Crystal Wiley, female    DOB: [birth year],    Past Medical History:  Diagnosis Date  . Gestational diabetes    Diet controlled  . SVD (spontaneous vaginal delivery) 11/18/2013  . SVD (spontaneous vaginal delivery) (12/27) 11/18/2013   Past Surgical History:  Procedure Laterality Date  . WISDOM TOOTH EXTRACTION     Social History   Social History  . Marital status: Married    Spouse name: N/A  . Number of children: N/A  . Years of education: N/A   Social History Main Topics  . Smoking status: Never Smoker  . Smokeless tobacco: Never Used  . Alcohol use No  . Drug use: No  . Sexual activity: Yes   Other Topics Concern  . None   Social History Narrative  . None   Outpatient Encounter Prescriptions as of 09/11/2016  Medication Sig  . levothyroxine (SYNTHROID, LEVOTHROID) 100 MCG tablet Take 1 tablet (100 mcg total) by mouth daily before breakfast.  . [DISCONTINUED] levothyroxine (SYNTHROID, LEVOTHROID) 88 MCG tablet Take 1 tablet (88 mcg total) by mouth daily before breakfast.  . [DISCONTINUED] metFORMIN (GLUCOPHAGE) 500 MG tablet Take 1 tablet (500 mg total) by mouth 2 (two) times daily with a meal.   No facility-administered encounter medications on file as of 09/11/2016.    ALLERGIES: No Known Allergies VACCINATION STATUS: Immunization History  Administered Date(s) Administered  . Influenza Split 08/24/2013  . Tdap 08/24/2013    HPI  Crystal Wiley is a 40-yr -old female patient with medical hx as above. She is here to f/u with repeat TFTs, and a1c. - She denies new complaints. Her insurance did not cover the weight loss medication,qsymia.  - She denies cold intolerance heat intolerance. She has Stopped metformin , due to some GI intolerance. Her last A1c was 5.6% . she was found to have clinical goiter and recent thyroid ultrasound confirmed mild goiter.  she has had hx of GDM about a year ago.  Review of  Systems  Constitutional: +weight loss of  12 pounds overall.  no fatigue, no subjective hyperthermia/hypothermia Eyes: no blurry vision, no xerophthalmia ENT: no sore throat, no nodules palpated in throat, no dysphagia/odynophagia, no hoarseness Cardiovascular: no CP/SOB/palpitations/leg swelling Respiratory: no cough/SOB Gastrointestinal: no N/V/D/C Musculoskeletal: no muscle/joint aches Skin: no rashes Neurological: no tremors/numbness/tingling/dizziness Psychiatric: no depression/anxiety   Objective:    BP 119/79   Pulse 83   Ht 5\' 3"  (1.6 m)   Wt 178 lb (80.7 kg)   BMI 31.53 kg/m   Wt Readings from Last 3 Encounters:  09/11/16 178 lb (80.7 kg)  03/12/16 183 lb (83 kg)  09/10/15 176 lb (79.8 kg)    Physical Exam  Constitutional: overweight, in NAD Eyes: PERRLA, EOMI, no exophthalmos ENT: moist mucous membranes, no thyromegaly, no cervical lymphadenopathy Cardiovascular: RRR, No MRG Respiratory: CTA B Gastrointestinal: abdomen soft, NT, ND, BS+ Musculoskeletal: no deformities, strength intact in all 4 Skin: moist, warm, no rashes Neurological: no tremor with outstretched hands, DTR normal in all 4  Results for orders placed or performed in visit on 09/11/16  TSH  Result Value Ref Range   TSH 1.69 0.41 - 5.90 uIU/mL   associated free T4 is 1.43 from 09/02/2015 (normal 0.82-1.77) Complete Blood Count (Most recent): Lab Results  Component Value Date   WBC 10.9 (H) 11/19/2013   HGB 11.1 (L) 11/19/2013   HCT 32.7 (L) 11/19/2013   MCV 86.5 11/19/2013   PLT 171  11/19/2013      Assessment & Plan:   1. Primary hypothyroidism - She will benefit from a slight increase in her levothyroxine. I will prescribed 100 g of levothyroxine by mouth every morning.   - We discussed about correct intake of levothyroxine, at fasting, with water, separated by at least 30 minutes from breakfast, and separated by more than 4 hours from calcium, iron, multivitamins, acid reflux  medications (PPIs). -Patient is made aware of the fact that thyroid hormone replacement is needed for life, dose to be adjusted by periodic monitoring of thyroid function tests.  2. Prediabetes: - She did not tolerate metformin and stopped it.  Her last A1c was  5. 6%. She has history of gestational diabetes, at risk for type 2 diabetes. She will have A1c repeated before next visit.  3. Obesity due to excess calories Her insurance did not cover Qsymia . -She is advised to continue on modified diet low in carbs and appropriate in protein. She is advised to exercise 30 minutes 3-4 days a week.   I advised patient to maintain close follow up with their PCP for primary care needs. Follow up plan: Return in about 3 months (around 12/12/2016) for follow up with pre-visit labs.  Marquis LunchGebre Ayomikun Starling, MD Phone: [phone]  Fax: [phone]   09/11/2016, 1:01 PM

## 2016-11-24 ENCOUNTER — Other Ambulatory Visit: Payer: Self-pay

## 2016-11-24 MED ORDER — LEVOTHYROXINE SODIUM 100 MCG PO TABS: 100.0000 ug | ORAL_TABLET | Freq: Every day | ORAL | 1 refills | Status: DC

## 2016-12-18 ENCOUNTER — Ambulatory Visit: Payer: Commercial Managed Care - PPO | Admitting: "Endocrinology

## 2016-12-21 LAB — HEMOGLOBIN A1C: Hemoglobin A1C: 5.4

## 2016-12-21 LAB — TSH: TSH: 2.15 u[IU]/mL (ref <=5.90)

## 2016-12-28 ENCOUNTER — Encounter: Payer: Self-pay | Admitting: "Endocrinology

## 2016-12-28 ENCOUNTER — Ambulatory Visit (INDEPENDENT_AMBULATORY_CARE_PROVIDER_SITE_OTHER): Payer: Commercial Managed Care - PPO | Admitting: "Endocrinology

## 2016-12-28 VITALS — BP 129/87 | HR 92 | Ht 63.0 in | Wt 184.0 lb

## 2016-12-28 DIAGNOSIS — E039 Hypothyroidism, unspecified: Secondary | ICD-10-CM

## 2016-12-28 DIAGNOSIS — R7303 Prediabetes: Secondary | ICD-10-CM | POA: Diagnosis not present

## 2016-12-28 MED ORDER — LEVOTHYROXINE SODIUM 112 MCG PO TABS: 112.0000 ug | ORAL_TABLET | Freq: Every day | ORAL | 1 refills | Status: DC

## 2016-12-28 NOTE — Progress Notes (Signed)
Subjective:    Patient ID: Crystal Wiley, female    DOB: Jul 27, 1976,    Past Medical History:  Diagnosis Date  . Gestational diabetes    Diet controlled  . SVD (spontaneous vaginal delivery) 11/18/2013  . SVD (spontaneous vaginal delivery) (12/27) 11/18/2013   Past Surgical History:  Procedure Laterality Date  . WISDOM TOOTH EXTRACTION     Social History   Social History  . Marital status: Married    Spouse name: N/A  . Number of children: N/A  . Years of education: N/A   Social History Main Topics  . Smoking status: Never Smoker  . Smokeless tobacco: Never Used  . Alcohol use No  . Drug use: No  . Sexual activity: Yes   Other Topics Concern  . None   Social History Narrative  . None   Outpatient Encounter Prescriptions as of 12/28/2016  Medication Sig  . levothyroxine (SYNTHROID, LEVOTHROID) 112 MCG tablet Take 1 tablet (112 mcg total) by mouth daily before breakfast.  . [DISCONTINUED] levothyroxine (SYNTHROID, LEVOTHROID) 100 MCG tablet Take 1 tablet (100 mcg total) by mouth daily before breakfast.   No facility-administered encounter medications on file as of 12/28/2016.    ALLERGIES: No Known Allergies VACCINATION STATUS: Immunization History  Administered Date(s) Administered  . Influenza Split 08/24/2013  . Tdap 08/24/2013    HPI  Crystal Wiley is a 41-yr -old female patient with medical hx as above. She is here to f/u with repeat TFTs, and a1c. - She denies new complaints. Her insurance did not cover the weight loss medication,qsymia.  - She denies cold intolerance heat intolerance. She has Stopped metformin , due to some GI intolerance. Her last A1c was 5.4% . she was found to have clinical goiter and recent thyroid ultrasound confirmed mild goiter.  she has had hx of GDM about a year ago.  Review of Systems  Constitutional: + Progressive weight gain.  no fatigue, no subjective hyperthermia/hypothermia Eyes: no blurry vision, no  xerophthalmia ENT: no sore throat, no nodules palpated in throat, no dysphagia/odynophagia, no hoarseness Cardiovascular: no CP/SOB/palpitations/leg swelling Respiratory: no cough/SOB Gastrointestinal: no N/V/D/C Musculoskeletal: no muscle/joint aches Skin: no rashes Neurological: no tremors/numbness/tingling/dizziness Psychiatric: no depression/anxiety   Objective:    BP 129/87   Pulse 92   Ht 5\' 3"  (1.6 m)   Wt 184 lb (83.5 kg)   BMI 32.59 kg/m   Wt Readings from Last 3 Encounters:  12/28/16 184 lb (83.5 kg)  09/11/16 178 lb (80.7 kg)  03/12/16 183 lb (83 kg)    Physical Exam  Constitutional: overweight, in NAD Eyes: PERRLA, EOMI, no exophthalmos ENT: moist mucous membranes, no thyromegaly, no cervical lymphadenopathy Cardiovascular: RRR, No MRG Respiratory: CTA B Gastrointestinal: abdomen soft, NT, ND, BS+ Musculoskeletal: no deformities, strength intact in all 4 Skin: moist, warm, no rashes Neurological: no tremor with outstretched hands, DTR normal in all 4  Results for orders placed or performed in visit on 12/28/16  Hemoglobin A1c  Result Value Ref Range   Hemoglobin A1C 5.6   TSH  Result Value Ref Range   TSH 1.12 0.41 - 5.90 uIU/mL  Hemoglobin A1c  Result Value Ref Range   Hemoglobin A1C 5.4   TSH  Result Value Ref Range   TSH 2.15 0.41 - 5.90 uIU/mL     Assessment & Plan:   1. Primary hypothyroidism - She will benefit from a slight increase in her levothyroxine. I will prescribe 112 g of levothyroxine by mouth  every morning.   - We discussed about correct intake of levothyroxine, at fasting, with water, separated by at least 30 minutes from breakfast, and separated by more than 4 hours from calcium, iron, multivitamins, acid reflux medications (PPIs). -Patient is made aware of the fact that thyroid hormone replacement is needed for life, dose to be adjusted by periodic monitoring of thyroid function tests.  2. Prediabetes: - She did not tolerate  metformin and stopped it.  Her last A1c was  5. 4%. She has history of gestational diabetes, at risk for type 2 diabetes. She will have A1c repeat in 1 year.   3. Obesity due to excess calories Her insurance did not cover Qsymia . -She is advised to continue on modified diet low in carbs and appropriate in protein. She is advised to exercise 60 minutes 3-4 days a week.   I advised patient to maintain close follow up with their PCP for primary care needs. Follow up plan: Return in about 6 months (around 06/27/2017) for follow up with pre-visit labs.  Marquis LunchGebre Cashlynn Yearwood, MD Phone: [phone]  Fax: [phone]   12/28/2016, 10:51 AM

## 2017-06-11 ENCOUNTER — Other Ambulatory Visit: Payer: Self-pay | Admitting: "Endocrinology

## 2017-06-17 ENCOUNTER — Ambulatory Visit (INDEPENDENT_AMBULATORY_CARE_PROVIDER_SITE_OTHER): Payer: Commercial Managed Care - PPO | Admitting: Nurse Practitioner

## 2017-06-17 ENCOUNTER — Encounter: Payer: Self-pay | Admitting: Family Medicine

## 2017-06-17 ENCOUNTER — Encounter: Payer: Self-pay | Admitting: Nurse Practitioner

## 2017-06-17 ENCOUNTER — Ambulatory Visit (HOSPITAL_COMMUNITY)
Admission: RE | Admit: 2017-06-17 | Discharge: 2017-06-17 | Disposition: A | Discharge status: Home / Self Care | Payer: Commercial Managed Care - PPO | Source: Ambulatory Visit | Attending: Nurse Practitioner | Admitting: Nurse Practitioner

## 2017-06-17 VITALS — BP 114/82 | Ht 63.0 in | Wt 165.0 lb

## 2017-06-17 DIAGNOSIS — M542 Cervicalgia: Secondary | ICD-10-CM

## 2017-06-17 DIAGNOSIS — M62838 Other muscle spasm: Secondary | ICD-10-CM | POA: Diagnosis not present

## 2017-06-17 DIAGNOSIS — M5030 Other cervical disc degeneration, unspecified cervical region: Secondary | ICD-10-CM | POA: Diagnosis not present

## 2017-06-17 MED ORDER — CHLORZOXAZONE 500 MG PO TABS: 500.0000 mg | ORAL_TABLET | Freq: Three times a day (TID) | ORAL | 0 refills | Status: DC | PRN

## 2017-06-17 MED ORDER — MELOXICAM 15 MG PO TABS: 15.0000 mg | ORAL_TABLET | Freq: Every day | ORAL | 0 refills | Status: DC
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
## 2017-06-17 NOTE — Patient Instructions (Signed)
Ardeth PerfectJennifer [facility] Deep tissue [facility] Hot Smart Relief Biofreeze

## 2017-06-23 ENCOUNTER — Encounter: Payer: Self-pay | Admitting: Nurse Practitioner

## 2017-06-23 NOTE — Telephone Encounter (Signed)
Nurse's-this message was forwarded to me.-Options for this patient would include a stronger muscle relaxer for nighttime to see if it will help her sleep or a mild pain medication like tramadol or Vicodin-she should be aware both of these are considered controlled medicines it would not necessarily be best for frequent use. Also if the patient has ongoing troubles she will need to consider following up a in the long run in-depth needing MRI-find out from the patient what she would prefer to try then send me a phone message/or talk with me directly and we will be happy to prescribe the medication

## 2017-06-24 ENCOUNTER — Other Ambulatory Visit: Payer: Self-pay | Admitting: Nurse Practitioner

## 2017-06-24 MED ORDER — HYDROCODONE-ACETAMINOPHEN 5-325 MG PO TABS: 1.0000 | ORAL_TABLET | ORAL | 0 refills | Status: DC | PRN

## 2017-06-24 NOTE — Progress Notes (Signed)
Reprinted on prescription paper

## 2017-06-24 NOTE — Progress Notes (Signed)
NCCSR reviewed. 

## 2017-07-05 ENCOUNTER — Ambulatory Visit: Payer: Commercial Managed Care - PPO | Admitting: "Endocrinology

## 2017-07-27 LAB — CBC AND DIFFERENTIAL: Hemoglobin: 11 — AB (ref 12.0–16.0)

## 2017-07-27 LAB — TSH: TSH: 0.31 — AB (ref <=5.90)

## 2017-07-27 LAB — HEMOGLOBIN A1C: Hemoglobin A1C: 5.2

## 2017-08-04 ENCOUNTER — Ambulatory Visit (INDEPENDENT_AMBULATORY_CARE_PROVIDER_SITE_OTHER): Payer: Commercial Managed Care - PPO | Admitting: "Endocrinology

## 2017-08-04 ENCOUNTER — Encounter: Payer: Self-pay | Admitting: "Endocrinology

## 2017-08-04 VITALS — BP 114/78 | HR 77 | Ht 63.0 in | Wt 162.0 lb

## 2017-08-04 DIAGNOSIS — D509 Iron deficiency anemia, unspecified: Secondary | ICD-10-CM

## 2017-08-04 DIAGNOSIS — E039 Hypothyroidism, unspecified: Secondary | ICD-10-CM | POA: Diagnosis not present

## 2017-08-04 DIAGNOSIS — R7303 Prediabetes: Secondary | ICD-10-CM

## 2017-08-04 MED ORDER — FERROUS SULFATE 325 (65 FE) MG PO TABS: 325.0000 mg | ORAL_TABLET | Freq: Every day | ORAL | 1 refills | Status: DC

## 2017-08-04 MED ORDER — FERROUS SULFATE 325 (65 FE) MG PO TABS: 325.0000 mg | ORAL_TABLET | Freq: Every day | ORAL | Status: DC

## 2017-08-04 MED ORDER — LEVOTHYROXINE SODIUM 112 MCG PO TABS: ORAL_TABLET | ORAL | 1 refills | Status: DC

## 2017-08-04 NOTE — Progress Notes (Signed)
Subjective:    Patient ID: Crystal Wiley, female    DOB: June 07, 1976,    Past Medical History:  Diagnosis Date  . Gestational diabetes    Diet controlled  . SVD (spontaneous vaginal delivery) 11/18/2013  . SVD (spontaneous vaginal delivery) (12/27) 11/18/2013   Past Surgical History:  Procedure Laterality Date  . WISDOM TOOTH EXTRACTION     Social History   Social History  . Marital status: Married    Spouse name: N/A  . Number of children: N/A  . Years of education: N/A   Social History Main Topics  . Smoking status: Never Smoker  . Smokeless tobacco: Never Used  . Alcohol use No  . Drug use: No  . Sexual activity: Yes   Other Topics Concern  . None   Social History Narrative  . None   Outpatient Encounter Prescriptions as of 08/04/2017  Medication Sig  . chlorzoxazone (PARAFON) 500 MG tablet Take 1 tablet (500 mg total) by mouth 3 (three) times daily as needed for muscle spasms.  . ferrous sulfate 325 (65 FE) MG tablet Take 1 tablet (325 mg total) by mouth daily with breakfast.  . HYDROcodone-acetaminophen (NORCO/VICODIN) 5-325 MG tablet Take 1 tablet by mouth every 4 (four) hours as needed.  Marland Kitchen levothyroxine (SYNTHROID, LEVOTHROID) 112 MCG tablet TAKE 1 TABLET BY MOUTH  DAILY BEFORE BREAKFAST  . meloxicam (MOBIC) 15 MG tablet Take 1 tablet (15 mg total) by mouth daily.  . [DISCONTINUED] levothyroxine (SYNTHROID, LEVOTHROID) 112 MCG tablet TAKE 1 TABLET BY MOUTH  DAILY BEFORE BREAKFAST  . [DISCONTINUED] ferrous sulfate tablet 325 mg    No facility-administered encounter medications on file as of 08/04/2017.    ALLERGIES: No Known Allergies VACCINATION STATUS: Immunization History  Administered Date(s) Administered  . Influenza Split 08/24/2013  . Tdap 08/24/2013    HPI  Crystal Wiley is a 41-yr -old female patient with medical hx as above. She is here to f/u  For hypothyroidism, prediabetes with repeat TFTs, and a1c. - She denies new complaints.  She lost  approximately 20 pounds. - She denies cold intolerance heat intolerance.. Her last A1c was 5.2%. she was found to have clinical goiter and recent thyroid ultrasound confirmed mild goiter.  she has had hx of GDM .  Review of Systems  Constitutional: + weight loss.  no fatigue, no subjective hyperthermia/hypothermia Eyes: no blurry vision, no xerophthalmia ENT: no sore throat, no nodules palpated in throat, no dysphagia/odynophagia, no hoarseness Cardiovascular: no CP/SOB/palpitations/leg swelling Respiratory: no cough/SOB Gastrointestinal: no N/V/D/C Musculoskeletal: no muscle/joint aches Skin: no rashes Neurological: no tremors/numbness/tingling/dizziness Psychiatric: no depression/anxiety   Objective:    BP 114/78   Pulse 77   Ht  (1.6 m)   Wt 162 lb (73.5 kg)   BMI 28.70 kg/m   Wt Readings from Last 3 Encounters:  08/04/17 162 lb (73.5 kg)  06/17/17 165 lb (74.8 kg)  12/28/16 184 lb (83.5 kg)    Physical Exam  Constitutional:  in NAD Eyes: PERRLA, EOMI, no exophthalmos ENT: moist mucous membranes, no thyromegaly, no cervical lymphadenopathy Cardiovascular: RRR, No MRG Respiratory: CTA B Gastrointestinal: abdomen soft, NT, ND, BS+ Musculoskeletal: no deformities, strength intact in all 4 Skin: moist, warm, no rashes Neurological: no tremor with outstretched hands, DTR normal in all 4  Results for orders placed or performed in visit on 08/04/17  CBC and differential  Result Value Ref Range   Hemoglobin 11.0 (A) 12.0 - 16.0  Hemoglobin A1c  Result  Value Ref Range   Hemoglobin A1C 5.2   TSH  Result Value Ref Range   TSH 0.31 (A) 0.41 - 5.90     Assessment & Plan:   1. Primary hypothyroidism from Hashimoto's thyroiditis - Based on her presenting thyroid function tests, she will benefit to stay on the current dose of levothyroxine at 112 g by mouth every morning.   - We discussed about correct intake of levothyroxine, at fasting, with water, separated  by at least 30 minutes from breakfast, and separated by more than 4 hours from calcium, iron, multivitamins, acid reflux medications (PPIs). -Patient is made aware of the fact that thyroid hormone replacement is needed for life, dose to be adjusted by periodic monitoring of thyroid function tests.  2. Prediabetes: - She did not tolerate metformin and stopped it.  Her last A1c was  5. 2%.  She has lost approximately 20 pounds over the last several months. She has history of gestational diabetes, at risk for type 2 diabetes. She will have A1c repeat in 1 year.   3. Iron deficiency anemia - She will benefit from ferrous sulfate tablet 325 mg by mouth daily for the next 90 days.  I advised patient to maintain close follow up with her PCP for primary care needs. Follow up plan: Return in about 6 months (around 02/01/2018) for follow up with pre-visit labs.  Marquis LunchGebre Parlee Amescua, MD Phone: [phone]  Fax: [phone]   This note was partially dictated with voice recognition software. Similar sounding words can be transcribed inadequately or may not  be corrected upon review.  08/04/2017, 11:56 AM

## 2017-09-02 ENCOUNTER — Other Ambulatory Visit: Payer: Self-pay | Admitting: "Endocrinology

## 2018-02-01 ENCOUNTER — Ambulatory Visit: Payer: Commercial Managed Care - PPO | Admitting: "Endocrinology

## 2018-05-26 ENCOUNTER — Other Ambulatory Visit: Payer: Self-pay | Admitting: "Endocrinology

## 2018-12-20 LAB — LIPID PANEL
Cholesterol: 178 (ref 0–200)
HDL: 75 — AB (ref 35–70)
LDL Cholesterol: 88
Triglycerides: 77 (ref 40–160)

## 2018-12-20 LAB — IRON,TIBC AND FERRITIN PANEL: %SAT: 9

## 2018-12-20 LAB — TSH: TSH: 0.68 (ref 0.41–5.90)

## 2018-12-20 LAB — BASIC METABOLIC PANEL
BUN: 5 (ref 4–21)
Creatinine: 0.6 (ref 0.5–1.1)

## 2018-12-20 LAB — HEMOGLOBIN A1C: Hemoglobin A1C: 5.5

## 2018-12-21 LAB — CBC AND DIFFERENTIAL
HCT: 33 — AB (ref 36–46)
Hemoglobin: 10.3 — AB (ref 12.0–16.0)

## 2019-01-04 ENCOUNTER — Encounter: Payer: Self-pay | Admitting: "Endocrinology

## 2019-01-04 ENCOUNTER — Ambulatory Visit: Payer: Commercial Managed Care - PPO | Admitting: "Endocrinology

## 2019-01-04 VITALS — BP 121/80 | HR 85 | Ht 63.0 in | Wt 186.0 lb

## 2019-01-04 DIAGNOSIS — R7303 Prediabetes: Secondary | ICD-10-CM

## 2019-01-04 DIAGNOSIS — E049 Nontoxic goiter, unspecified: Secondary | ICD-10-CM

## 2019-01-04 DIAGNOSIS — E039 Hypothyroidism, unspecified: Secondary | ICD-10-CM

## 2019-01-04 NOTE — Progress Notes (Signed)
Endocrinology follow-up note  Subjective:    Patient ID: Crystal Wiley, female    DOB: 1976-05-20,    Past Medical History:  Diagnosis Date  . Gestational diabetes    Diet controlled  . SVD (spontaneous vaginal delivery) 11/18/2013  . SVD (spontaneous vaginal delivery) (12/27) 11/18/2013   Past Surgical History:  Procedure Laterality Date  . WISDOM TOOTH EXTRACTION     Social History   Socioeconomic History  . Marital status: Married    Spouse name: Not on file  . Number of children: Not on file  . Years of education: Not on file  . Highest education level: Not on file  Occupational History  . Not on file  Social Needs  . Financial resource strain: Not on file  . Food insecurity:    Worry: Not on file    Inability: Not on file  . Transportation needs:    Medical: Not on file    Non-medical: Not on file  Tobacco Use  . Smoking status: Never Smoker  . Smokeless tobacco: Never Used  Substance and Sexual Activity  . Alcohol use: No  . Drug use: No  . Sexual activity: Yes  Lifestyle  . Physical activity:    Days per week: Not on file    Minutes per session: Not on file  . Stress: Not on file  Relationships  . Social connections:    Talks on phone: Not on file    Gets together: Not on file    Attends religious service: Not on file    Active member of club or organization: Not on file    Attends meetings of clubs or organizations: Not on file    Relationship status: Not on file  Other Topics Concern  . Not on file  Social History Narrative  . Not on file   Outpatient Encounter Medications as of 01/04/2019  Medication Sig  . levothyroxine (SYNTHROID, LEVOTHROID) 112 MCG tablet TAKE 1 TABLET BY MOUTH  DAILY BEFORE BREAKFAST  . WELLBUTRIN SR 150 MG 12 hr tablet 2 (two) times daily.  . [DISCONTINUED] chlorzoxazone (PARAFON) 500 MG tablet Take 1 tablet (500 mg total) by mouth 3 (three) times daily as needed for muscle spasms.  . [DISCONTINUED] ferrous sulfate  325 (65 FE) MG tablet Take 1 tablet (325 mg total) by mouth daily with breakfast.  . [DISCONTINUED] HYDROcodone-acetaminophen (NORCO/VICODIN) 5-325 MG tablet Take 1 tablet by mouth every 4 (four) hours as needed.  . [DISCONTINUED] meloxicam (MOBIC) 15 MG tablet Take 1 tablet (15 mg total) by mouth daily.   No facility-administered encounter medications on file as of 01/04/2019.    ALLERGIES: No Known Allergies VACCINATION STATUS: Immunization History  Administered Date(s) Administered  . Influenza Split 08/24/2013  . Influenza,inj,Quad PF,6+ Mos 09/10/2018  . Influenza,trivalent, recombinat, inj, PF 08/22/2017  . Tdap 08/24/2013    HPI  Crystal Wiley is a 43 year old female patient  with medical hx as above. She is here to f/u  For hypothyroidism, prediabetes with repeat TFTs, and a1c. - She denies new complaints.  She is currently gaining weight.   - She denies cold intolerance heat intolerance.  Her previsit thyroid function tests are consistent with appropriate replacement.  Her repeat labs show A1c of 5.5%.   she was found to have clinical goiter and recent thyroid ultrasound confirmed mild goiter.  She reports that she is getting choking sensation on and off.  she has had hx of GDM .  Review of Systems  Constitutional: + weight gain.  no fatigue, no subjective hyperthermia/hypothermia Eyes: no blurry vision, no xerophthalmia ENT: no sore throat, no nodules palpated in throat, no dysphagia/odynophagia, no hoarseness Musculoskeletal: no muscle/joint aches Skin: no rashes Neurological: no tremors/numbness/tingling/dizziness Psychiatric: no depression/anxiety   Objective:    BP 121/80   Pulse 85   Ht 5\' 3"  (1.6 m)   Wt 186 lb (84.4 kg)   BMI 32.95 kg/m   Wt Readings from Last 3 Encounters:  01/04/19 186 lb (84.4 kg)  08/04/17 162 lb (73.5 kg)  06/17/17 165 lb (74.8 kg)    Physical Exam  Constitutional:  in NAD Eyes: PERRLA, EOMI, no exophthalmos ENT: moist mucous  membranes, + palpable thyromegaly , no cervical lymphadenopathy Cardiovascular: RRR, No MRG Respiratory: CTA B Gastrointestinal: abdomen soft, NT, ND, BS+ Musculoskeletal: no deformities, strength intact in all 4 Skin: moist, warm, no rashes Neurological: no tremor with outstretched hands, DTR normal in all 4  Results for orders placed or performed in visit on 01/04/19  Iron, TIBC and Ferritin Panel  Result Value Ref Range   %SAT 9   CBC and differential  Result Value Ref Range   Hemoglobin 10.3 (A) 12.0 - 16.0   HCT 33 (A) 36 - 46  Basic metabolic panel  Result Value Ref Range   BUN 5 4 - 21   Creatinine 0.6 0.5 - 1.1  Lipid panel  Result Value Ref Range   Triglycerides 77 40 - 160   Cholesterol 178 0 - 200   HDL 75 (A) 35 - 70   LDL Cholesterol 88   Hemoglobin A1c  Result Value Ref Range   Hemoglobin A1C 5.5   TSH  Result Value Ref Range   TSH 0.68 0.41 - 5.90     Assessment & Plan:   1. Primary hypothyroidism from Hashimoto's thyroiditis -Her previsit thyroid function tests are consistent with appropriate replacement.  She is advised to continue on her current dose of  levothyroxine at 112 g by mouth every morning.   - We discussed about the correct intake of her thyroid hormone, on empty stomach at fasting, with water, separated by at least 30 minutes from breakfast and other medications,  and separated by more than 4 hours from calcium, iron, multivitamins, acid reflux medications (PPIs). -Patient is made aware of the fact that thyroid hormone replacement is needed for life, dose to be adjusted by periodic monitoring of thyroid function tests.   2. Prediabetes: - She did not tolerate metformin during a previous attempt.  Her previsit labs show A1c of 5.5%.  She would not need any medication intervention at this time.   -Her current weight gain is concerning after she was previously losing.    - Patient admits there is a room for improvement in her diet and drink  choices. -  Suggestion is made for her to avoid simple carbohydrates  from her diet including Cakes, Sweet Desserts / Pastries, Ice Cream, Soda (diet and regular), Sweet Tea, Candies, Chips, Cookies, Store Bought Juices, Alcohol in Excess of  1-2 drinks a day, Artificial Sweeteners, and "Sugar-free" Products. This will help patient to have stable blood glucose profile and potentially avoid unintended weight gain.   3. Iron deficiency anemia - She has not been taking her iron sulfate which was advised during her last visit, however, she has started recently.  She is advised to continue ferrous sulfate tablet 325 mg by mouth daily .  She is working with her OB/GYN provider  for endometrial ablation related to her heavy menstrual blood flows.    I advised patient to maintain close follow up with her PCP for primary care needs. Follow up plan: Return in about 1 year (around 01/05/2020), or U/S anytime, for Follow up with Pre-visit Labs, Thyroid / Neck Ultrasound.  Marquis LunchGebre Derika Eckles, MD Phone: [phone]  Fax: [phone]   This note was partially dictated with voice recognition software. Similar sounding words can be transcribed inadequately or may not  be corrected upon review.  01/04/2019, 9:05 AM

## 2019-08-24 HISTORY — PX: ABLATION: SHX5711

## 2019-12-28 ENCOUNTER — Telehealth: Payer: Self-pay | Admitting: "Endocrinology

## 2019-12-28 DIAGNOSIS — E049 Nontoxic goiter, unspecified: Secondary | ICD-10-CM

## 2019-12-28 DIAGNOSIS — E039 Hypothyroidism, unspecified: Secondary | ICD-10-CM

## 2019-12-28 NOTE — Telephone Encounter (Signed)
Faxed order for ultrasound.

## 2019-12-28 NOTE — Telephone Encounter (Signed)
Pt has an appt with Dr Fransico Him 2/12. She needs her order sent to [facility]. Fax number [phone]

## 2020-01-03 ENCOUNTER — Ambulatory Visit
Admission: RE | Admit: 2020-01-03 | Discharge: 2020-01-03 | Disposition: A | Discharge status: Home / Self Care | Payer: PRIVATE HEALTH INSURANCE | Source: Ambulatory Visit | Attending: "Endocrinology | Admitting: "Endocrinology

## 2020-01-03 DIAGNOSIS — E049 Nontoxic goiter, unspecified: Secondary | ICD-10-CM

## 2020-01-03 DIAGNOSIS — E039 Hypothyroidism, unspecified: Secondary | ICD-10-CM

## 2020-01-05 ENCOUNTER — Ambulatory Visit: Payer: Commercial Managed Care - PPO | Admitting: "Endocrinology

## 2020-01-08 ENCOUNTER — Ambulatory Visit (INDEPENDENT_AMBULATORY_CARE_PROVIDER_SITE_OTHER): Payer: Managed Care, Other (non HMO) | Admitting: "Endocrinology

## 2020-01-08 ENCOUNTER — Other Ambulatory Visit: Payer: Self-pay

## 2020-01-08 ENCOUNTER — Encounter: Payer: Self-pay | Admitting: "Endocrinology

## 2020-01-08 VITALS — BP 116/81 | HR 80 | Ht 63.0 in | Wt 181.8 lb

## 2020-01-08 DIAGNOSIS — R7303 Prediabetes: Secondary | ICD-10-CM

## 2020-01-08 DIAGNOSIS — E039 Hypothyroidism, unspecified: Secondary | ICD-10-CM | POA: Diagnosis not present

## 2020-01-08 DIAGNOSIS — E559 Vitamin D deficiency, unspecified: Secondary | ICD-10-CM

## 2020-01-08 MED ORDER — LEVOTHYROXINE SODIUM 112 MCG PO TABS: ORAL_TABLET | ORAL | 3 refills | Status: DC

## 2020-01-08 NOTE — Progress Notes (Signed)
01/08/2020  Endocrinology follow-up note  Subjective:    Patient ID: Crystal Wiley, female    DOB: 1976/06/11,    Past Medical History:  Diagnosis Date  . Gestational diabetes    Diet controlled  . SVD (spontaneous vaginal delivery) 11/18/2013  . SVD (spontaneous vaginal delivery) (12/27) 11/18/2013   Past Surgical History:  Procedure Laterality Date  . WISDOM TOOTH EXTRACTION     Social History   Socioeconomic History  . Marital status: Married    Spouse name: Not on file  . Number of children: Not on file  . Years of education: Not on file  . Highest education level: Not on file  Occupational History  . Not on file  Tobacco Use  . Smoking status: Never Smoker  . Smokeless tobacco: Never Used  Substance and Sexual Activity  . Alcohol use: No  . Drug use: No  . Sexual activity: Yes  Other Topics Concern  . Not on file  Social History Narrative  . Not on file   Social Determinants of Health   Financial Resource Strain:   . Difficulty of Paying Living Expenses: Not on file  Food Insecurity:   . Worried About Programme researcher, broadcasting/film/video in the Last Year: Not on file  . Ran Out of Food in the Last Year: Not on file  Transportation Needs:   . Lack of Transportation (Medical): Not on file  . Lack of Transportation (Non-Medical): Not on file  Physical Activity:   . Days of Exercise per Week: Not on file  . Minutes of Exercise per Session: Not on file  Stress:   . Feeling of Stress : Not on file  Social Connections:   . Frequency of Communication with Friends and Family: Not on file  . Frequency of Social Gatherings with Friends and Family: Not on file  . Attends Religious Services: Not on file  . Active Member of Clubs or Organizations: Not on file  . Attends Banker Meetings: Not on file  . Marital Status: Not on file   Outpatient Encounter Medications as of 01/08/2020  Medication Sig  . Cholecalciferol 1.25 MG (50000 UT) TABS Take 1 capsule by mouth  daily with breakfast.  . levothyroxine (SYNTHROID) 112 MCG tablet TAKE 1 TABLET BY MOUTH  DAILY BEFORE BREAKFAST  . WELLBUTRIN SR 150 MG 12 hr tablet 2 (two) times daily.  . [DISCONTINUED] ferrous sulfate 325 (65 FE) MG tablet Take 325 mg by mouth daily with breakfast.  . [DISCONTINUED] levothyroxine (SYNTHROID, LEVOTHROID) 112 MCG tablet TAKE 1 TABLET BY MOUTH  DAILY BEFORE BREAKFAST   No facility-administered encounter medications on file as of 01/08/2020.   ALLERGIES: No Known Allergies VACCINATION STATUS: Immunization History  Administered Date(s) Administered  . Influenza Split 08/24/2013  . Influenza,inj,Quad PF,6+ Mos 09/10/2018  . Influenza,trivalent, recombinat, inj, PF 08/22/2017  . Tdap 08/24/2013    HPI  Crystal Wiley is a 44 year old female patient  with medical hx as above. She is here to f/u  For hypothyroidism, prediabetes with repeat TFTs, and a1c. - She denies new complaints.  She reports compliance to medication. -  Her previsit thyroid function tests are consistent with appropriate replacement.  Her repeat labs show A1c of 5.5%.   she was found to have clinical goiter and recent thyroid ultrasound confirmed mild goiter, repeat ultrasound showing a slight decrease in size and improvement in that echogenicity of the thyroid.  No discrete nodules.  She denies dysphagia, shortness of breath, voice  change. she has had hx of GDM .   Objective:    BP 116/81   Pulse 80   Ht 5\' 3"  (1.6 m)   Wt 181 lb 12.8 oz (82.5 kg)   BMI 32.20 kg/m   Wt Readings from Last 3 Encounters:  01/08/20 181 lb 12.8 oz (82.5 kg)  01/04/19 186 lb (84.4 kg)  08/04/17 162 lb (73.5 kg)    Physical Exam   Physical Exam- Limited  Constitutional:  Body mass index is 32.2 kg/m. , not in acute distress, normal state of mind Eyes:  EOMI, no exophthalmos Neck: Supple Thyroid: No gross goiter Respiratory: Adequate breathing efforts Musculoskeletal: no gross deformities, strength intact in  all four extremities, no gross restriction of joint movements Skin:  no rashes, no hyperemia Neurological: no tremor with outstretched hands,    Results for orders placed or performed in visit on 01/04/19  Iron, TIBC and Ferritin Panel  Result Value Ref Range   %SAT 9   CBC and differential  Result Value Ref Range   Hemoglobin 10.3 (A) 12.0 - 16.0   HCT 33 (A) 36 - 46  Basic metabolic panel  Result Value Ref Range   BUN 5 4 - 21   Creatinine 0.6 0.5 - 1.1  Lipid panel  Result Value Ref Range   Triglycerides 77 40 - 160   Cholesterol 178 0 - 200   HDL 75 (A) 35 - 70   LDL Cholesterol 88   Hemoglobin A1c  Result Value Ref Range   Hemoglobin A1C 5.5   TSH  Result Value Ref Range   TSH 0.68 0.41 - 5.90     Assessment & Plan:   1. Primary hypothyroidism from Hashimoto's thyroiditis -Her previsit thyroid function tests are consistent with appropriate replacement.  She is advised to continue   levothyroxine a112 g by mouth every morning.   - We discussed about the correct intake of her thyroid hormone, on empty stomach at fasting, with water, separated by at least 30 minutes from breakfast and other medications,  and separated by more than 4 hours from calcium, iron, multivitamins, acid reflux medications (PPIs). -Patient is made aware of the fact that thyroid hormone replacement is needed for life, dose to be adjusted by periodic monitoring of thyroid function tests.  2. Prediabetes: - She did not tolerate metformin during a previous attempt.  Her previsit labs show A1c remaining stable at 5.5%.    -She will not need any medication intervention at this time.    -  Suggestion is made for her to avoid simple carbohydrates  from her diet including Cakes, Sweet Desserts / Pastries, Ice Cream, Soda (diet and regular), Sweet Tea, Candies, Chips, Cookies, Sweet Pastries,  Store Bought Juices, Alcohol in Excess of  1-2 drinks a day, Artificial Sweeteners, Coffee Creamer, and  "Sugar-free" Products. This will help patient to have stable blood glucose profile and potentially avoid unintended weight gain.    I advised patient to maintain close follow up with her PCP for primary care needs.     - Time spent on this patient care encounter:  25 minutes of which 50% was spent in  counseling and the rest reviewing  her current and  previous labs / studies and medications  doses and developing a plan for long term care. Lucita Ferrara  participated in the discussions, expressed understanding, and voiced agreement with the above plans.  All questions were answered to her satisfaction. she is encouraged to  contact clinic should she have any questions or concerns prior to her return visit.  Follow up plan: Return in about 1 year (around 01/07/2021) for Follow up with Pre-visit Labs, Next Visit A1c in Office.  Marquis Lunch, MD Phone: [phone]  Fax: [phone]   This note was partially dictated with voice recognition software. Similar sounding words can be transcribed inadequately or may not  be corrected upon review.  01/08/2020, 6:10 PM

## 2020-01-10 ENCOUNTER — Other Ambulatory Visit: Payer: PRIVATE HEALTH INSURANCE

## 2020-01-21 ENCOUNTER — Ambulatory Visit
Admission: EM | Admit: 2020-01-21 | Discharge: 2020-01-21 | Disposition: A | Discharge status: Home / Self Care | Payer: Managed Care, Other (non HMO) | Attending: Emergency Medicine | Admitting: Emergency Medicine

## 2020-01-21 ENCOUNTER — Other Ambulatory Visit: Payer: Self-pay

## 2020-01-21 DIAGNOSIS — Z20822 Contact with and (suspected) exposure to covid-19: Secondary | ICD-10-CM | POA: Insufficient documentation

## 2020-01-21 DIAGNOSIS — N12 Tubulo-interstitial nephritis, not specified as acute or chronic: Secondary | ICD-10-CM | POA: Diagnosis present

## 2020-01-21 LAB — POC SARS CORONAVIRUS 2 AG -  ED: SARS Coronavirus 2 Ag: NEGATIVE

## 2020-01-21 LAB — POCT URINALYSIS DIP (MANUAL ENTRY)
Bilirubin, UA: NEGATIVE
Glucose, UA: NEGATIVE mg/dL
Nitrite, UA: POSITIVE — AB
Protein Ur, POC: 30 mg/dL — AB
Spec Grav, UA: 1.02 (ref 1.010–1.025)
Urobilinogen, UA: 0.2 E.U./dL
pH, UA: 6 (ref 5.0–8.0)

## 2020-01-21 MED ORDER — LEVOFLOXACIN 750 MG PO TABS: 750.0000 mg | ORAL_TABLET | Freq: Every day | ORAL | 0 refills | Status: AC

## 2020-01-21 MED ORDER — ONDANSETRON HCL 4 MG PO TABS: 4.0000 mg | ORAL_TABLET | Freq: Three times a day (TID) | ORAL | 0 refills | Status: DC | PRN

## 2020-01-21 MED ORDER — CEFTRIAXONE SODIUM 1 G IJ SOLR
1.0000 g | Freq: Once | INTRAMUSCULAR | Status: AC
  Administered 2020-01-21: 1 g via INTRAMUSCULAR

## 2020-01-21 NOTE — Discharge Instructions (Addendum)
Urine culture sent.  We will call you with the results.   Patient was advised to go to ED for further evaluation to rule out sepsis Rocephin shot was given in office Levofloxacin 750 mg daily x5 days was prescribed Zofran was prescribed for nausea Advised to take Tylenol for fever Follow up with PCP if symptoms persists Return here or go to ER if you have any new or worsening symptoms such as fever, worsening abdominal pain, nausea/vomiting, flank pain

## 2020-01-21 NOTE — ED Triage Notes (Signed)
Pt presents to UC w/ c/o fever since yesterday. Highest 103.3. Pt states lowest fever has been after taking ibuprofen and tylenol is 100.7. Pt states she has pain in left lower abd to left lower back for 1 week. When pain shoots, pt feels nauseous.

## 2020-01-21 NOTE — ED Provider Notes (Signed)
[facility]    CSN: [id number] Arrival date & time: 01/21/20  1124      History   Chief Complaint Chief Complaint  Patient presents with  . Fever  . LLQ pain, lt lower back pain    HPI Crystal Wiley is a 44 y.o. female.   who presents with complaint of fever for the past 1 day.  Report left lower quadrant and left lower back pain and nausea for the past 1 week.  Tmax as home was 103 F, 102.6 F in office today.  Denies precipitating event or positive sick exposure.  Has tried OTC tylenol with mild relief.  Denies aggravating or alleviating factors.  Reports similar symptoms in the past that resolved with medication.   Denies fever, chills, decreased appetite, decreased activity, otalgia, drooling, vomiting, cough, wheezing, rash, changes in bowel or bladder function.  Patient is currently menstruating.   The history is provided by the patient. No language interpreter was used.  Fever   Past Medical History:  Diagnosis Date  . Gestational diabetes    Diet controlled  . SVD (spontaneous vaginal delivery) 11/18/2013  . SVD (spontaneous vaginal delivery) (12/27) 11/18/2013    Patient Active Problem List   Diagnosis Date Noted  . Vitamin D deficiency 01/08/2020  . Iron deficiency anemia 08/04/2017  . Primary hypothyroidism 09/10/2015  . Prediabetes 09/10/2015  . Obesity due to excess calories 09/10/2015  . SVD (spontaneous vaginal delivery) (12/27) 11/18/2013    Past Surgical History:  Procedure Laterality Date  . ABLATION  08/2019   ovarian  . WISDOM TOOTH EXTRACTION      OB History    Gravida  3   Para  2   Term  2   Preterm      AB  1   Living  2     SAB  1   TAB      Ectopic      Multiple      Live Births  2            Home Medications    Prior to Admission medications   Medication Sig Start Date End Date Taking? Authorizing Provider  ferrous sulfate 325 (65 FE) MG EC tablet Take 325 mg by mouth 3 (three) times daily  with meals.   Yes [provider]  magnesium 30 MG tablet Take 30 mg by mouth 2 (two) times daily.   Yes [provider]  Cholecalciferol 1.25 MG (50000 UT) TABS Take 1 capsule by mouth daily with breakfast.    [provider]  levofloxacin (LEVAQUIN) 750 MG tablet Take 1 tablet (750 mg total) by mouth daily for 5 days. 01/21/20 01/26/20  Durward Parcel, FNP  levothyroxine (SYNTHROID) 112 MCG tablet TAKE 1 TABLET BY MOUTH  DAILY BEFORE BREAKFAST 01/08/20   Roma Kayser, MD  ondansetron (ZOFRAN) 4 MG tablet Take 1 tablet (4 mg total) by mouth every 8 (eight) hours as needed for nausea or vomiting. 01/21/20   Catha Ontko, Zachery Dakins, FNP  WELLBUTRIN SR 150 MG 12 hr tablet 2 (two) times daily. 12/07/18   [provider]    Family History Family History  Problem Relation Age of Onset  . Cancer Paternal Aunt        breast  . Diabetes Paternal Grandmother   . Cancer Paternal Grandmother        pancreatic cancer    Social History Social History   Tobacco Use  . Smoking  status: Never Smoker  . Smokeless tobacco: Never Used  Substance Use Topics  . Alcohol use: No  . Drug use: No     Allergies   Patient has no known allergies.   Review of Systems Review of Systems  Constitutional: Positive for fever.  Respiratory: Negative.   Cardiovascular: Negative.   Gastrointestinal: Positive for abdominal pain.  Musculoskeletal: Positive for back pain.  All other systems reviewed and are negative.    Physical Exam Triage Vital Signs ED Triage Vitals  Enc Vitals Group     BP 01/21/20 1131 121/88     Pulse Rate 01/21/20 1131 (!) 133     Resp 01/21/20 1131 16     Temp 01/21/20 1131 (!) 102.6 F (39.2 C)     Temp Source 01/21/20 1131 Oral     SpO2 01/21/20 1131 96 %     Weight --      Height --      Head Circumference --      Peak Flow --      Pain Score 01/21/20 1137 8     Pain Loc --      Pain Edu? --      Excl. in GC? --    No data  found.  Updated Vital Signs BP 121/88 (BP Location: Right Arm)   Pulse (!) 133   Temp (!) 102.6 F (39.2 C) (Oral)   Resp 16   SpO2 96%   Visual Acuity Right Eye Distance:   Left Eye Distance:   Bilateral Distance:    Right Eye Near:   Left Eye Near:    Bilateral Near:     Physical Exam Vitals and nursing note reviewed.  Constitutional:      General: She is not in acute distress.    Appearance: Normal appearance. She is normal weight. She is not ill-appearing, toxic-appearing or diaphoretic.  Cardiovascular:     Rate and Rhythm: Regular rhythm. Tachycardia present.     Pulses: Normal pulses.     Heart sounds: Normal heart sounds. No murmur. No gallop.   Pulmonary:     Effort: Pulmonary effort is normal. No respiratory distress.     Breath sounds: Normal breath sounds. No stridor. No wheezing, rhonchi or rales.  Chest:     Chest wall: No tenderness.  Abdominal:     General: Abdomen is flat. Bowel sounds are normal. There is no distension.     Palpations: Abdomen is soft. There is no mass.     Tenderness: There is abdominal tenderness in the left lower quadrant. There is no right CVA tenderness, left CVA tenderness, guarding or rebound.     Hernia: No hernia is present.  Musculoskeletal:     Thoracic back: Tenderness present.  Neurological:     Mental Status: She is alert and oriented to person, place, and time.      UC Treatments / Results  Labs (all labs ordered are listed, but only abnormal results are displayed) Labs Reviewed  POCT URINALYSIS DIP (MANUAL ENTRY) - Abnormal; Notable for the following components:      Result Value   Clarity, UA cloudy (*)    Ketones, POC UA trace (5) (*)    Blood, UA large (*)    Protein Ur, POC =30 (*)    Nitrite, UA Positive (*)    Leukocytes, UA Large (3+) (*)    All other components within normal limits  URINE CULTURE  POC SARS CORONAVIRUS 2 AG -  ED  EKG   Radiology No results found.  Procedures Procedures  (including critical care time)  Medications Ordered in UC Medications  cefTRIAXone (ROCEPHIN) injection 1 g (1 g Intramuscular Given 01/21/20 1208)    Initial Impression / Assessment and Plan / UC Course  I have reviewed the triage vital signs and the nursing notes.  Pertinent labs & imaging results that were available during my care of the patient were reviewed by me and considered in my medical decision making (see chart for details).     Patient is discharged in stable condition and was advised to go to the ED for further evaluation regarding possibility of sepsis.  Patient had refused to go to ED. POCT COVID-19 test was negative POCT urine analysis showed large amount of red blood cell, positive nitrate, and large amount of leukocyte.  Symptoms consistent with possible pyelonephritis Rocephin IM 1 g was given in office Levofloxacin 750 mg daily x5 days as prescribed  Zofran was prescribed Advised to continue to take Tylenol as needed for fever Advised patient to go to ED   Final Clinical Impressions(s) / UC Diagnoses   Final diagnoses:  Pyelonephritis     Discharge Instructions     Urine culture sent.  We will call you with the results.   Patient was advised to go to ED for further evaluation to rule out sepsis Rocephin shot was given in office Levofloxacin 750 mg daily x5 days was prescribed Zofran was prescribed for nausea Advised to take Tylenol for fever Follow up with PCP if symptoms persists Return here or go to ER if you have any new or worsening symptoms such as fever, worsening abdominal pain, nausea/vomiting, flank pain     ED Prescriptions    Medication Sig Dispense Auth. Provider   levofloxacin (LEVAQUIN) 750 MG tablet Take 1 tablet (750 mg total) by mouth daily for 5 days. 5 tablet Brittnee Gaetano S, FNP   ondansetron (ZOFRAN) 4 MG tablet Take 1 tablet (4 mg total) by mouth every 8 (eight) hours as needed for nausea or vomiting. 20 tablet Zevin Nevares,  Darrelyn Hillock, FNP     PDMP not reviewed this encounter.   Emerson Monte, [place] 01/21/20 1223

## 2020-01-22 ENCOUNTER — Other Ambulatory Visit: Payer: Self-pay

## 2020-01-22 ENCOUNTER — Emergency Department (HOSPITAL_COMMUNITY)
Admission: EM | Admit: 2020-01-22 | Discharge: 2020-01-22 | Disposition: A | Discharge status: Home / Self Care | Payer: Managed Care, Other (non HMO) | Attending: Emergency Medicine | Admitting: Emergency Medicine

## 2020-01-22 ENCOUNTER — Encounter (HOSPITAL_COMMUNITY): Payer: Self-pay | Admitting: Emergency Medicine

## 2020-01-22 ENCOUNTER — Emergency Department (HOSPITAL_COMMUNITY): Payer: Managed Care, Other (non HMO)

## 2020-01-22 DIAGNOSIS — N12 Tubulo-interstitial nephritis, not specified as acute or chronic: Secondary | ICD-10-CM | POA: Diagnosis not present

## 2020-01-22 DIAGNOSIS — Z79899 Other long term (current) drug therapy: Secondary | ICD-10-CM | POA: Insufficient documentation

## 2020-01-22 DIAGNOSIS — R109 Unspecified abdominal pain: Secondary | ICD-10-CM | POA: Diagnosis present

## 2020-01-22 LAB — CBC WITH DIFFERENTIAL/PLATELET
Abs Immature Granulocytes: 0.07 10*3/uL (ref 0.00–0.07)
Basophils Absolute: 0 10*3/uL (ref 0.0–0.1)
Basophils Relative: 0 %
Eosinophils Absolute: 0 10*3/uL (ref 0.0–0.5)
Eosinophils Relative: 0 %
HCT: 35.7 % — ABNORMAL LOW (ref 36.0–46.0)
Hemoglobin: 11.7 g/dL — ABNORMAL LOW (ref 12.0–15.0)
Immature Granulocytes: 1 %
Lymphocytes Relative: 20 %
Lymphs Abs: 2 10*3/uL (ref 0.7–4.0)
MCH: 27.5 pg (ref 26.0–34.0)
MCHC: 32.8 g/dL (ref 30.0–36.0)
MCV: 83.8 fL (ref 80.0–100.0)
Monocytes Absolute: 0.9 10*3/uL (ref 0.1–1.0)
Monocytes Relative: 9 %
Neutro Abs: 7.2 10*3/uL (ref 1.7–7.7)
Neutrophils Relative %: 70 %
Platelets: 227 10*3/uL (ref 150–400)
RBC: 4.26 MIL/uL (ref 3.87–5.11)
RDW: 14.6 % (ref 11.5–15.5)
WBC: 10.2 10*3/uL (ref 4.0–10.5)
nRBC: 0 % (ref 0.0–0.2)

## 2020-01-22 LAB — COMPREHENSIVE METABOLIC PANEL
ALT: 18 U/L (ref 0–44)
AST: 12 U/L — ABNORMAL LOW (ref 15–41)
Albumin: 3.4 g/dL — ABNORMAL LOW (ref 3.5–5.0)
Alkaline Phosphatase: 80 U/L (ref 38–126)
Anion gap: 9 (ref 5–15)
BUN: 8 mg/dL (ref 6–20)
CO2: 24 mmol/L (ref 22–32)
Calcium: 8.6 mg/dL — ABNORMAL LOW (ref 8.9–10.3)
Chloride: 103 mmol/L (ref 98–111)
Creatinine, Ser: 0.73 mg/dL (ref 0.44–1.00)
GFR calc Af Amer: >60 mL/min (ref >60)
GFR calc non Af Amer: >60 mL/min (ref >60)
Glucose, Bld: 116 mg/dL — ABNORMAL HIGH (ref 70–99)
Potassium: 3.2 mmol/L — ABNORMAL LOW (ref 3.5–5.1)
Sodium: 136 mmol/L (ref 135–145)
Total Bilirubin: 0.5 mg/dL (ref 0.3–1.2)
Total Protein: 7.4 g/dL (ref 6.5–8.1)

## 2020-01-22 LAB — I-STAT BETA HCG BLOOD, ED (MC, WL, AP ONLY): I-stat hCG, quantitative: 5.6 m[IU]/mL — ABNORMAL HIGH (ref <5)

## 2020-01-22 LAB — LACTIC ACID, PLASMA: Lactic Acid, Venous: 0.7 mmol/L (ref 0.5–1.9)

## 2020-01-22 LAB — HCG, QUANTITATIVE, PREGNANCY: hCG, Beta Chain, Quant, S: <1 m[IU]/mL (ref <5)

## 2020-01-22 MED ORDER — HYDROCODONE-ACETAMINOPHEN 5-325 MG PO TABS: 1.0000 | ORAL_TABLET | ORAL | 0 refills | Status: DC | PRN

## 2020-01-22 MED ORDER — POTASSIUM CHLORIDE CRYS ER 20 MEQ PO TBCR
40.0000 meq | EXTENDED_RELEASE_TABLET | Freq: Once | ORAL | Status: AC
  Administered 2020-01-22: 40 meq via ORAL
  Filled 2020-01-22: qty 2

## 2020-01-22 MED ORDER — KETOROLAC TROMETHAMINE 30 MG/ML IJ SOLN
30.0000 mg | Freq: Once | INTRAMUSCULAR | Status: AC
  Administered 2020-01-22: 30 mg via INTRAVENOUS
  Filled 2020-01-22: qty 1

## 2020-01-22 MED ORDER — SODIUM CHLORIDE 0.9 % IV BOLUS
1000.0000 mL | Freq: Once | INTRAVENOUS | Status: AC
  Administered 2020-01-22: 1000 mL via INTRAVENOUS

## 2020-01-22 MED ORDER — SODIUM CHLORIDE 0.9 % IV SOLN
1.0000 g | Freq: Once | INTRAVENOUS | Status: AC
  Administered 2020-01-22: 1 g via INTRAVENOUS
  Filled 2020-01-22: qty 10

## 2020-01-22 MED ORDER — MORPHINE SULFATE (PF) 4 MG/ML IV SOLN
4.0000 mg | Freq: Once | INTRAVENOUS | Status: AC
  Administered 2020-01-22: 4 mg via INTRAVENOUS
  Filled 2020-01-22: qty 1

## 2020-01-22 MED ORDER — ONDANSETRON 4 MG PO TBDP: 4.0000 mg | ORAL_TABLET | Freq: Three times a day (TID) | ORAL | 0 refills | Status: DC | PRN

## 2020-01-22 NOTE — Discharge Instructions (Signed)
Take the pain medicine as prescribed.  I have also given you medicine for nausea and vomiting called Zofran.  These are disintegrating tablets use it under your tongue.  Take the antibiotics Levaquin as prescribed to you by urgent care.  You may also continue to take Tylenol ibuprofen as needed for pain and fever.  If you have worsening pain despite pain medicine, vomiting despite Zofran please seek reevaluation emergency department.

## 2020-01-22 NOTE — ED Triage Notes (Signed)
Pt reports being at [facility] yesterday and being dx with pyelonephritis and given a shot of abx and started on Levaquin. Was instructed to come to ED but waited until today. 2 tylenol taken this morning for fever.

## 2020-01-22 NOTE — ED Provider Notes (Signed)
[facility] EMERGENCY DEPARTMENT Provider Note   CSN: [id number] Arrival date & time: 01/22/20  4709    History Chief Complaint  Patient presents with  . Fever  . Flank Pain    Crystal Wiley is a 44 y.o. female with past medical history significant prediabetes who presents for evaluation of flank pain.  Patient seen by urgent care yesterday for flank pain, positive urinalysis. Dx with Pyelonephritis. She was febrile and tachycardic at that time.  They apparently recommended to come to the emergency department however patient declined.  She was given Rocephin and started on Levaquin.  Patient returns today for evaluation.  Last dose Tylenol 630 this morning.  She denies any pain.  She states she has had some left lower back pain left lower quadrant pain x1 week.  No recent Covid exposures.  Denies additional aggravating or alleviating factors.  Patient denies currently being on her menstrual cycle however states she occasionally spots throughout the month.  She has no concerns for pregnancy.  No pelvic pain, vaginal discharge.  No nausea, vomiting, chest pain, shortness of breath.  No prior history of stones.  Denies additional aggravating or alleviating factors.  History obtained from patient and past medical records.  No interpreter is used.  HPI     Past Medical History:  Diagnosis Date  . Gestational diabetes    Diet controlled  . SVD (spontaneous vaginal delivery) 11/18/2013  . SVD (spontaneous vaginal delivery) (12/27) 11/18/2013    Patient Active Problem List   Diagnosis Date Noted  . Vitamin D deficiency 01/08/2020  . Iron deficiency anemia 08/04/2017  . Primary hypothyroidism 09/10/2015  . Prediabetes 09/10/2015  . Obesity due to excess calories 09/10/2015  . SVD (spontaneous vaginal delivery) (12/27) 11/18/2013    Past Surgical History:  Procedure Laterality Date  . ABLATION  08/2019   ovarian  . WISDOM TOOTH EXTRACTION       OB History    Gravida  3   Para    2   Term  2   Preterm      AB  1   Living  2     SAB  1   TAB      Ectopic      Multiple      Live Births  2           Family History  Problem Relation Age of Onset  . Cancer Paternal Aunt        breast  . Diabetes Paternal Grandmother   . Cancer Paternal Grandmother        pancreatic cancer    Social History   Tobacco Use  . Smoking status: Never Smoker  . Smokeless tobacco: Never Used  Substance Use Topics  . Alcohol use: No  . Drug use: No    Home Medications Prior to Admission medications   Medication Sig Start Date End Date Taking? Authorizing Provider  Cholecalciferol 1.25 MG (50000 UT) TABS Take 1 capsule by mouth daily with breakfast.   Yes [provider]  ferrous sulfate 325 (65 FE) MG EC tablet Take 325 mg by mouth 3 (three) times daily with meals.   Yes [provider]  levofloxacin (LEVAQUIN) 750 MG tablet Take 1 tablet (750 mg total) by mouth daily for 5 days. 01/21/20 01/26/20 Yes Avegno, Zachery Dakins, FNP  levothyroxine (SYNTHROID) 112 MCG tablet TAKE 1 TABLET BY MOUTH  DAILY BEFORE BREAKFAST Patient taking differently: Take 112 mcg by mouth daily before  breakfast. TAKE 1 TABLET BY MOUTH  DAILY BEFORE BREAKFAST 01/08/20  Yes Nida, Denman George, MD  magnesium 30 MG tablet Take 30 mg by mouth 2 (two) times daily.   Yes [provider]  [facility] SR 150 MG 12 hr tablet Take 200 mg by mouth 2 (two) times daily.  12/07/18  Yes [provider]  HYDROcodone-acetaminophen (NORCO/VICODIN) 5-325 MG tablet Take 1 tablet by mouth every 4 (four) hours as needed. 01/22/20   Andreah Goheen A, PA-C  ondansetron (ZOFRAN ODT) 4 MG disintegrating tablet Take 1 tablet (4 mg total) by mouth every 8 (eight) hours as needed for nausea or vomiting. 01/22/20   Malikhi Ogan A, PA-C  ondansetron (ZOFRAN) 4 MG tablet Take 1 tablet (4 mg total) by mouth every 8 (eight) hours as needed for nausea or vomiting. Patient not taking: Reported  on 01/22/2020 01/21/20   Durward Parcel, FNP    Allergies    Patient has no known allergies.  Review of Systems   Review of Systems  Constitutional: Positive for chills, fatigue and fever.  HENT: Negative.   Respiratory: Negative.   Cardiovascular: Negative.   Gastrointestinal: Positive for abdominal pain. Negative for abdominal distention, anal bleeding, blood in stool, constipation, diarrhea, nausea and vomiting.  Genitourinary: Positive for dysuria and flank pain.  Musculoskeletal: Negative for arthralgias, back pain, gait problem, joint swelling, myalgias, neck pain and neck stiffness.  Skin: Negative.   Neurological: Negative.   All other systems reviewed and are negative.   Physical Exam Updated Vital Signs BP 113/80 (BP Location: Left Arm)   Pulse 98   Temp 99.3 F (37.4 C) (Oral)   Resp 18   Ht 5\' 1"  (1.549 m)   Wt 82.1 kg   SpO2 99%   BMI 34.20 kg/m   Physical Exam Vitals and nursing note reviewed.  Constitutional:      General: She is not in acute distress.    Appearance: She is well-developed. She is obese. She is not toxic-appearing.  HENT:     Head: Normocephalic and atraumatic.     Nose: Nose normal.     Mouth/Throat:     Mouth: Mucous membranes are moist.     Pharynx: Oropharynx is clear.  Eyes:     Pupils: Pupils are equal, round, and reactive to light.  Cardiovascular:     Rate and Rhythm: Normal rate.     Pulses: Normal pulses.     Heart sounds: Normal heart sounds.  Pulmonary:     Effort: Pulmonary effort is normal. No respiratory distress.     Breath sounds: Normal breath sounds.  Abdominal:     General: There is no distension.     Palpations: Abdomen is soft.     Tenderness: There is abdominal tenderness in the left lower quadrant. There is left CVA tenderness. There is no right CVA tenderness, guarding or rebound. Negative signs include Murphy's sign and McBurney's sign.     Hernia: No hernia is present.     Comments: Tenderness to left  lower quadrant and left flank however negative CVA tap.  Musculoskeletal:        General: Normal range of motion.     Cervical back: Normal range of motion.       Back:     Comments: No midline spinal tenderness.  No overlying skin changes to back  Skin:    General: Skin is warm and dry.     Comments: Brisk cap refill  Neurological:  Mental Status: She is alert.    ED Results / Procedures / Treatments   Labs (all labs ordered are listed, but only abnormal results are displayed) Labs Reviewed  CBC WITH DIFFERENTIAL/PLATELET - Abnormal; Notable for the following components:      Result Value   Hemoglobin 11.7 (*)    HCT 35.7 (*)    All other components within normal limits  COMPREHENSIVE METABOLIC PANEL - Abnormal; Notable for the following components:   Potassium 3.2 (*)    Glucose, Bld 116 (*)    Calcium 8.6 (*)    Albumin 3.4 (*)    AST 12 (*)    All other components within normal limits  I-STAT BETA HCG BLOOD, ED ([facility], [facility], [facility] ONLY) - Abnormal; Notable for the following components:   I-stat hCG, quantitative 5.6 (*)    All other components within normal limits  CULTURE, BLOOD (ROUTINE X 2)  CULTURE, BLOOD (ROUTINE X 2)  LACTIC ACID, PLASMA  HCG, QUANTITATIVE, PREGNANCY    EKG None  Radiology CT Renal Stone Study  Result Date: 01/22/2020 CLINICAL DATA:  Left flank pain. Pyelonephritis. Left lower quadrant pain. Hematuria. EXAM: CT ABDOMEN AND PELVIS WITHOUT CONTRAST TECHNIQUE: Multidetector CT imaging of the abdomen and pelvis was performed following the standard protocol without IV contrast. COMPARISON:  None. FINDINGS: Lower chest: Lung bases are clear. Heart size normal. No pericardial or pleural effusion. Distal esophagus is grossly unremarkable. Hepatobiliary: Faint low-attenuation lesion in the periphery of the right hepatic lobe measures 11 mm (2/13), difficult to further characterize due to size. There may be sludge layering in the gallbladder. No biliary ductal  dilatation. Pancreas: Negative. Spleen: Negative. Adrenals/Urinary Tract: No urinary stones. Moderate right and mild left hydronephrosis without cause identified. Bladder is somewhat distended with urine. Stomach/Bowel: Stomach, small bowel, appendix and colon are unremarkable. Vascular/Lymphatic: Retroperitoneal lymph nodes are not enlarged by CT size criteria. No pathologically enlarged lymph nodes. Vascular structures are unremarkable. Reproductive: Uterus is visualized. A 4.4 x 5.7 cm low-attenuation left adnexal mass presumably arises from the left ovary. No right adnexal mass. Other: No free fluid.  Mesenteries and peritoneum are unremarkable. Musculoskeletal: No worrisome lytic or sclerotic lesions. IMPRESSION: 1. 5.7 cm cystic left adnexal mass likely rises from the left ovary and may represent a benign physiologic cyst. Baseline pelvic ultrasound is recommended in further evaluation, as clinically indicated. 2. Bilateral hydronephrosis without cause identified. Electronically Signed   By: Leanna Battles M.D.   On: 01/22/2020 12:52    Procedures Procedures (including critical care time)  Medications Ordered in ED Medications  sodium chloride 0.9 % bolus 1,000 mL (0 mLs Intravenous Stopped 01/22/20 1304)  cefTRIAXone (ROCEPHIN) 1 g in sodium chloride 0.9 % 100 mL IVPB (0 g Intravenous Stopped 01/22/20 1317)  morphine 4 MG/ML injection 4 mg (4 mg Intravenous Given 01/22/20 1145)  ketorolac (TORADOL) 30 MG/ML injection 30 mg (30 mg Intravenous Given 01/22/20 1312)  potassium chloride SA (KLOR-CON) CR tablet 40 mEq (40 mEq Oral Given 01/22/20 1311)    ED Course  I have reviewed the triage vital signs and the nursing notes.  Pertinent labs & imaging results that were available during my care of the patient were reviewed by me and considered in my medical decision making (see chart for details).  44 year old female presents for evaluation of pyelonephritis.  Afebrile here however mildly tachycardic.   Seen at urgent care yesterday evening diagnosed with pyelonephritis.  She is febrile and tachycardic at that time.  They  wanted her to come to the emergency department last night to rule out sepsis given she was febrile and tachycardic however patient did not want to at that time.  She received Rocephin and 1 dose of Levaquin.  She has not taken her antibiotics today.  She last had Tylenol at 630 this morning.  Patient with left flank and left lower quadrant pain.  No prior history of stones.  Does not appear septic on exam however will obtain labs, CT stone study and reevaluate.  Patient's urinalysis yesterday with large blood, positive nitrite, large leuks.  This was sent for culture however has not returned. Do not think we need to repeat UA given last one less than 24 hours ago.  Labs and imaging personally reviewed and interpreted I-STAT hCG 5.6 however quant hCG less than 1, low suspicion for pregnancy. CBC without leukocytosis, hemoglobin 11.5 Lactic acid 0.7 Metabolic panel with potassium at 3.2, mild hyperglycemia to 116 additional electrolyte, renal or liver normality, will give potassium supplementation  1140: Patient reassessed.  She is requesting pain medicine.  Will give morphine. Pending Ct Stone to r/o infected stone given unilateral flank pain.  1305: Patient reassessed. CT with some bilateral hydronephrosis however no stone or hydroureter.  Pain reassessed and controlled in the ED.  We will have her p.o. challenge. No emesis in ED.   1430: Patient reassessed.  She is tolerating p.o. intake without difficulty.  No current pain.  Abdomen soft.  She is to continue taking antibiotics described by urgent care.  Urine was sent from culture from urgent care where is not resulted.  No evidence of infected stone.  She does have a left cystic type mass to her left ovary.  Discussed follow-up with OB/GYN for this. Low suspicion for torsion, PID, TOA.  The patient has been appropriately  medically screened and/or stabilized in the ED. I have low suspicion for any other emergent medical condition which would require further screening, evaluation or treatment in the ED or require inpatient management.  Patient is hemodynamically stable and in no acute distress.  Patient able to ambulate in department prior to ED.  Evaluation does not show acute pathology that would require ongoing or additional emergent interventions while in the emergency department or further inpatient treatment.  I have discussed the diagnosis with the patient and answered all questions.  Pain is been managed while in the emergency department and patient has no further complaints prior to discharge.  Patient is comfortable with plan discussed in room and is stable for discharge at this time.  I have discussed strict return precautions for returning to the emergency department.  Patient was encouraged to follow-up with PCP/specialist refer to at discharge.     MDM Rules/Calculators/A&P                       Final Clinical Impression(s) / ED Diagnoses Final diagnoses:  Pyelonephritis    Rx / DC Orders ED Discharge Orders         Ordered    HYDROcodone-acetaminophen (NORCO/VICODIN) 5-325 MG tablet  Every 4 hours PRN     01/22/20 1432    ondansetron (ZOFRAN ODT) 4 MG disintegrating tablet  Every 8 hours PRN     01/22/20 1432           Raesha Coonrod A, PA-C 01/22/20 1436    Milton Ferguson, MD 01/22/20 1456

## 2020-01-24 LAB — URINE CULTURE: Culture: >=100000 — AB

## 2020-01-27 LAB — CULTURE, BLOOD (ROUTINE X 2)
Culture: NO GROWTH
Culture: NO GROWTH
Special Requests: ADEQUATE
Special Requests: ADEQUATE

## 2020-06-20 ENCOUNTER — Ambulatory Visit: Payer: Managed Care, Other (non HMO) | Admitting: Nurse Practitioner

## 2020-06-20 ENCOUNTER — Other Ambulatory Visit: Payer: Self-pay

## 2020-06-20 ENCOUNTER — Other Ambulatory Visit (HOSPITAL_COMMUNITY)
Admission: RE | Admit: 2020-06-20 | Discharge: 2020-06-20 | Disposition: A | Discharge status: Home / Self Care | Payer: Managed Care, Other (non HMO) | Source: Ambulatory Visit | Attending: Nurse Practitioner | Admitting: Nurse Practitioner

## 2020-06-20 ENCOUNTER — Encounter: Payer: Self-pay | Admitting: Nurse Practitioner

## 2020-06-20 VITALS — BP 138/82 | Temp 97.5°F | Wt 185.0 lb

## 2020-06-20 DIAGNOSIS — R1032 Left lower quadrant pain: Secondary | ICD-10-CM | POA: Insufficient documentation

## 2020-06-20 LAB — CBC WITH DIFFERENTIAL/PLATELET
Abs Immature Granulocytes: 0.06 10*3/uL (ref 0.00–0.07)
Basophils Absolute: 0 10*3/uL (ref 0.0–0.1)
Basophils Relative: 1 %
Eosinophils Absolute: 0.2 10*3/uL (ref 0.0–0.5)
Eosinophils Relative: 2 %
HCT: 34.9 % — ABNORMAL LOW (ref 36.0–46.0)
Hemoglobin: 11.4 g/dL — ABNORMAL LOW (ref 12.0–15.0)
Immature Granulocytes: 1 %
Lymphocytes Relative: 30 %
Lymphs Abs: 2.7 10*3/uL (ref 0.7–4.0)
MCH: 28.6 pg (ref 26.0–34.0)
MCHC: 32.7 g/dL (ref 30.0–36.0)
MCV: 87.5 fL (ref 80.0–100.0)
Monocytes Absolute: 0.4 10*3/uL (ref 0.1–1.0)
Monocytes Relative: 5 %
Neutro Abs: 5.5 10*3/uL (ref 1.7–7.7)
Neutrophils Relative %: 61 %
Platelets: 288 10*3/uL (ref 150–400)
RBC: 3.99 MIL/uL (ref 3.87–5.11)
RDW: 13.5 % (ref 11.5–15.5)
WBC: 8.9 10*3/uL (ref 4.0–10.5)
nRBC: 0 % (ref 0.0–0.2)

## 2020-06-20 LAB — BASIC METABOLIC PANEL
Anion gap: 8 (ref 5–15)
BUN: 8 mg/dL (ref 6–20)
CO2: 26 mmol/L (ref 22–32)
Calcium: 8.8 mg/dL — ABNORMAL LOW (ref 8.9–10.3)
Chloride: 101 mmol/L (ref 98–111)
Creatinine, Ser: 0.6 mg/dL (ref 0.44–1.00)
GFR calc Af Amer: >60 mL/min (ref >60)
GFR calc non Af Amer: >60 mL/min (ref >60)
Glucose, Bld: 97 mg/dL (ref 70–99)
Potassium: 3.9 mmol/L (ref 3.5–5.1)
Sodium: 135 mmol/L (ref 135–145)

## 2020-06-20 MED ORDER — NAPROXEN 500 MG PO TABS: 500.0000 mg | ORAL_TABLET | Freq: Two times a day (BID) | ORAL | 0 refills | Status: DC

## 2020-06-20 NOTE — Progress Notes (Signed)
   Subjective:    Patient ID: Crystal Wiley, female    DOB: 08/21/1976, 44 y.o.   MRN: [id number]  Abdominal Pain This is a new problem. Episode onset: last week  The pain is located in the LLQ and left flank (pressure beside navel area). Quality: pressure at navel, stabbing/jabbing pain in back. The abdominal pain does not radiate. The pain is aggravated by eating and being still. Relieved by: pain med. Treatments tried: heating pad, ibuprofen, tylenol and ice. The treatment provided no relief.  Describes pain as constant, worse at times, especially at night. Can lay on her left side. Radiates along the left lower flank area to the posterior axillary line.   Pt has cyst on ovary and swollen fallopian tube Worse at night, severe pain. Front and back pain left side. Pt was prescribed Percocet and did take one tablet and that helped some.  Started with seeing her NP at work. Urine showed infection and blood. Treated with Bactrim. Culture came back positive for e coli. GYN checked urine at that visit and urine was clear. On 7/26, she had a pelvic US including transvaginal.  Her GYN recommended a visit with her primary to also discuss possible diverticulitis.  No fever. No blood in her stool or change in color.   Review of Systems  Gastrointestinal: Positive for abdominal pain.       Objective:   Physical Exam NAD. Alert, oriented. Lungs clear. Heart RRR. Abdomen mildly distended with hypoactive BS x 4. Distinct tenderness in the LLQ into the lower flank area with palpation. Some guarding. No rebound pain. Pelvic US dated 06/17/20: post ablation endometrium; enlarged left ovary with multiple complex cysts; elongated structure posterior to left ovary (question hematosalpinx vs bowel). See scanned report.         Assessment & Plan:  LLQ abdominal pain - Plan: CT Abdomen Pelvis W Contrast, Basic Metabolic Panel (BMET), CBC with Differential  Meds ordered this encounter  Medications  . naproxen  (NAPROSYN) 500 MG tablet    Sig: Take 1 tablet (500 mg total) by mouth 2 (two) times daily with a meal. Prn pain    Dispense:  30 tablet    Refill:  0    Order Specific Question:   Supervising Provider    Answer:   Lilyan Punt A [[id number]]    Stat labs and CT scan with contrast. Because of the questionable view on [place] and symptoms, will work up possible diverticulitis.  Reviewed warning signs. Go to ED immediately if worse.

## 2020-06-21 ENCOUNTER — Telehealth: Payer: Self-pay | Admitting: *Deleted

## 2020-06-22 ENCOUNTER — Encounter: Payer: Self-pay | Admitting: Nurse Practitioner

## 2020-06-22 NOTE — Telephone Encounter (Signed)
Nurse contacted patient 7/30. Message sent through my chart.

## 2020-07-18 ENCOUNTER — Other Ambulatory Visit: Payer: Self-pay | Admitting: Urology

## 2020-07-22 ENCOUNTER — Other Ambulatory Visit: Payer: Self-pay | Admitting: Obstetrics

## 2020-07-24 ENCOUNTER — Other Ambulatory Visit: Payer: Self-pay | Admitting: Urology

## 2020-07-24 NOTE — Patient Instructions (Addendum)
YOU ARE SCHEDULED FOR A COVID TEST _9/9________@__2 :55__________. THIS TEST MUST BE DONE BEFORE SURGERY. GO TO  [address] [place], [place], IT IS APPROXIMATELY 2 MINUTES PAST [employer] ON THE RIGHT AND REMAIN IN YOUR CAR, THIS IS A DRIVE UP TEST. ONCE YOUR COVID TEST IS DONE PLEASE FOLLOW ALL THE QUARANTINE  INSTRUCTIONS GIVEN IN YOUR HANDOUT.      Your procedure is scheduled on : 08/05/20   Report to [facility] AT 11:00  A. M.   Call this number if you have problems the morning of surgery  :[phone].   OUR ADDRESS IS [address]  WE ARE LOCATED IN [facility].  PLEASE BRING YOUR INSURANCE CARD AND PHOTO ID DAY OF SURGERY.  ONLY ONE PERSON ALLOWED IN FACILITY WAITING AREA.                                     REMEMBER:  DO NOT EAT FOOD OR DRINK LIQUIDS AFTER MIDNIGHT .   YOU MAY  BRUSH YOUR TEETH MORNING OF SURGERY AND RINSE YOUR MOUTH OUT, NO CHEWING GUM CANDY OR MINTS.    TAKE THESE MEDICATIONS MORNING OF SURGERY WITH A SIP OF WATER: buspirone, Wellbutrin, Levothyroxine __________________________________  ONE VISITOR IS ALLOWED IN WAITING ROOM ONLY DAY OF SURGERY.    NO VISITOR MAY SPEND THE NIGHT.  VISITOR ARE ALLOWED TO STAY UNTIL 8:00 PM.                                    DO NOT WEAR JEWERLY, MAKE UP, OR NAIL POLISH ON FINGERNAILS.  DO NOT WEAR LOTIONS, POWDERS, PERFUMES OR DEODORANT.  DO NOT SHAVE FOR 24 HOURS PRIOR TO DAY OF SURGERY.  CONTACTS, GLASSES, OR DENTURES MAY NOT BE WORN TO SURGERY.                                    [facility] IS NOT RESPONSIBLE  FOR ANY BELONGINGS.                                                                    Marland Kitchen      [facility] - Preparing for Surgery Before surgery, you can play an important role.  Because skin is not sterile, your skin needs to be as free of germs as possible.  You can reduce the number of germs on your skin by washing with CHG (chlorahexidine  gluconate) soap before surgery.  CHG is an antiseptic cleaner which kills germs and bonds with the skin to continue killing germs even after washing. Please DO NOT use if you have an allergy to CHG or antibacterial soaps.  If your skin becomes reddened/irritated stop using the CHG and inform your nurse when you arrive at Short Stay. Do not shave (including legs and underarms) for at least 48 hours prior to the first CHG shower.  You may shave your face/neck. Please follow these instructions carefully:  1.  Shower with CHG Soap  the night before surgery and the  morning of Surgery.  2.  If you choose to wash your hair, wash your hair first as usual with your  normal  shampoo.  3.  After you shampoo, rinse your hair and body thoroughly to remove the  shampoo.                                        4.  Use CHG as you would any other liquid soap.  You can apply chg directly  to the skin and wash                       Gently with a scrungie or clean washcloth.  5.  Apply the CHG Soap to your body ONLY FROM THE NECK DOWN.   Do not use on face/ open                           Wound or open sores. Avoid contact with eyes, ears mouth and genitals (private parts).                       Wash face,  Genitals (private parts) with your normal soap.             6.  Wash thoroughly, paying special attention to the area where your surgery  will be performed.  7.  Thoroughly rinse your body with warm water from the neck down.  8.  DO NOT shower/wash with your normal soap after using and rinsing off  the CHG Soap.             9.  Pat yourself dry with a clean towel.            10.  Wear clean pajamas.            11.  Place clean sheets on your bed the night of your first shower and do not  sleep with pets. Day of Surgery : Do not apply any lotions/deodorants the morning of surgery.  Please wear clean clothes to the hospital/surgery center.  FAILURE TO FOLLOW THESE INSTRUCTIONS MAY RESULT IN THE CANCELLATION OF YOUR  SURGERY PATIENT SIGNATURE_________________________________  NURSE SIGNATURE__________________________________  ________________________________________________________________________

## 2020-07-25 ENCOUNTER — Other Ambulatory Visit: Payer: Self-pay

## 2020-07-25 ENCOUNTER — Encounter (HOSPITAL_COMMUNITY)
Admission: RE | Admit: 2020-07-25 | Discharge: 2020-07-25 | Disposition: A | Discharge status: Home / Self Care | Payer: Managed Care, Other (non HMO) | Source: Ambulatory Visit | Attending: Obstetrics | Admitting: Obstetrics

## 2020-07-25 ENCOUNTER — Encounter (HOSPITAL_COMMUNITY): Payer: Self-pay

## 2020-07-25 DIAGNOSIS — Z01812 Encounter for preprocedural laboratory examination: Secondary | ICD-10-CM | POA: Diagnosis not present

## 2020-07-25 HISTORY — DX: Anxiety disorder, unspecified: F41.9

## 2020-07-25 HISTORY — DX: Hypothyroidism, unspecified: E03.9

## 2020-07-25 LAB — TYPE AND SCREEN
ABO/RH(D): A NEG
Antibody Screen: NEGATIVE

## 2020-07-25 LAB — BASIC METABOLIC PANEL
Anion gap: 8 (ref 5–15)
BUN: 6 mg/dL (ref 6–20)
CO2: 25 mmol/L (ref 22–32)
Calcium: 8.9 mg/dL (ref 8.9–10.3)
Chloride: 109 mmol/L (ref 98–111)
Creatinine, Ser: 0.5 mg/dL (ref 0.44–1.00)
GFR calc Af Amer: >60 mL/min (ref >60)
GFR calc non Af Amer: >60 mL/min (ref >60)
Glucose, Bld: 89 mg/dL (ref 70–99)
Potassium: 3.7 mmol/L (ref 3.5–5.1)
Sodium: 142 mmol/L (ref 135–145)

## 2020-07-25 LAB — CBC
HCT: 37.2 % (ref 36.0–46.0)
Hemoglobin: 12.3 g/dL (ref 12.0–15.0)
MCH: 28.6 pg (ref 26.0–34.0)
MCHC: 33.1 g/dL (ref 30.0–36.0)
MCV: 86.5 fL (ref 80.0–100.0)
Platelets: 286 10*3/uL (ref 150–400)
RBC: 4.3 MIL/uL (ref 3.87–5.11)
RDW: 14.1 % (ref 11.5–15.5)
WBC: 7.2 10*3/uL (ref 4.0–10.5)
nRBC: 0 % (ref 0.0–0.2)

## 2020-07-25 NOTE — Sedation Documentation (Addendum)
COVID Vaccine Completed:No Date COVID Vaccine completed: COVID vaccine manufacturer: Pfizer    Moderna   [employer]   PCP - Dr. Fletcher Anon Cardiologist - no  Chest x-ray - no EKG - no Stress Test - no ECHO - no Cardiac Cath - no  Sleep Study - no CPAP -   Fasting Blood Sugar - NA Checks Blood Sugar _____ times a day  Blood Thinner Instructions:NA Aspirin Instructions: Last Dose:  Anesthesia review:   Patient denies shortness of breath, fever, cough and chest pain at PAT appointment  yes   Patient verbalized understanding of instructions that were given to them at the PAT appointment. Patient was also instructed that they will need to review over the PAT instructions again at home before surgery. Yes  Pt is able to climb 3-4 flights of stairs without SOB.

## 2020-08-01 ENCOUNTER — Other Ambulatory Visit (HOSPITAL_COMMUNITY): Payer: Managed Care, Other (non HMO)

## 2020-08-01 ENCOUNTER — Other Ambulatory Visit (HOSPITAL_COMMUNITY)
Admission: RE | Admit: 2020-08-01 | Discharge: 2020-08-01 | Disposition: A | Discharge status: Home / Self Care | Payer: Managed Care, Other (non HMO) | Source: Ambulatory Visit | Attending: Obstetrics | Admitting: Obstetrics

## 2020-08-01 DIAGNOSIS — Z20822 Contact with and (suspected) exposure to covid-19: Secondary | ICD-10-CM | POA: Diagnosis not present

## 2020-08-01 DIAGNOSIS — Z01812 Encounter for preprocedural laboratory examination: Secondary | ICD-10-CM | POA: Diagnosis not present

## 2020-08-01 LAB — SARS CORONAVIRUS 2 (TAT 6-24 HRS): SARS Coronavirus 2: NEGATIVE

## 2020-08-05 ENCOUNTER — Encounter (HOSPITAL_BASED_OUTPATIENT_CLINIC_OR_DEPARTMENT_OTHER): Payer: Self-pay | Admitting: Obstetrics

## 2020-08-05 ENCOUNTER — Encounter (HOSPITAL_COMMUNITY)
Admission: RE | Disposition: A | Discharge status: Home / Self Care | Payer: Self-pay | Source: Ambulatory Visit | Attending: Obstetrics

## 2020-08-05 ENCOUNTER — Ambulatory Visit (HOSPITAL_BASED_OUTPATIENT_CLINIC_OR_DEPARTMENT_OTHER): Payer: Managed Care, Other (non HMO) | Admitting: Anesthesiology

## 2020-08-05 ENCOUNTER — Ambulatory Visit (HOSPITAL_COMMUNITY): Payer: Managed Care, Other (non HMO)

## 2020-08-05 ENCOUNTER — Other Ambulatory Visit: Payer: Self-pay

## 2020-08-05 ENCOUNTER — Inpatient Hospital Stay (HOSPITAL_BASED_OUTPATIENT_CLINIC_OR_DEPARTMENT_OTHER)
Admission: RE | Admit: 2020-08-05 | Discharge: 2020-08-08 | DRG: 743 | Disposition: A | Discharge status: Home / Self Care | Payer: Managed Care, Other (non HMO) | Source: Ambulatory Visit | Attending: Obstetrics | Admitting: Obstetrics

## 2020-08-05 DIAGNOSIS — Z79899 Other long term (current) drug therapy: Secondary | ICD-10-CM

## 2020-08-05 DIAGNOSIS — N7013 Chronic salpingitis and oophoritis: Principal | ICD-10-CM | POA: Diagnosis present

## 2020-08-05 DIAGNOSIS — Z20822 Contact with and (suspected) exposure to covid-19: Secondary | ICD-10-CM | POA: Diagnosis present

## 2020-08-05 DIAGNOSIS — R000 Tachycardia, unspecified: Secondary | ICD-10-CM | POA: Diagnosis not present

## 2020-08-05 DIAGNOSIS — Z90710 Acquired absence of both cervix and uterus: Secondary | ICD-10-CM | POA: Diagnosis present

## 2020-08-05 DIAGNOSIS — F419 Anxiety disorder, unspecified: Secondary | ICD-10-CM | POA: Diagnosis present

## 2020-08-05 DIAGNOSIS — N736 Female pelvic peritoneal adhesions (postinfective): Secondary | ICD-10-CM | POA: Diagnosis present

## 2020-08-05 DIAGNOSIS — N920 Excessive and frequent menstruation with regular cycle: Secondary | ICD-10-CM | POA: Diagnosis present

## 2020-08-05 DIAGNOSIS — Z79890 Hormone replacement therapy (postmenopausal): Secondary | ICD-10-CM

## 2020-08-05 DIAGNOSIS — Z90721 Acquired absence of ovaries, unilateral: Secondary | ICD-10-CM | POA: Diagnosis present

## 2020-08-05 DIAGNOSIS — Z79891 Long term (current) use of opiate analgesic: Secondary | ICD-10-CM

## 2020-08-05 DIAGNOSIS — G8918 Other acute postprocedural pain: Secondary | ICD-10-CM | POA: Diagnosis not present

## 2020-08-05 DIAGNOSIS — E876 Hypokalemia: Secondary | ICD-10-CM | POA: Diagnosis not present

## 2020-08-05 DIAGNOSIS — E039 Hypothyroidism, unspecified: Secondary | ICD-10-CM | POA: Diagnosis present

## 2020-08-05 HISTORY — PX: ROBOTIC ASSISTED TOTAL HYSTERECTOMY WITH BILATERAL SALPINGO OOPHERECTOMY: SHX6086

## 2020-08-05 HISTORY — PX: LYSIS OF ADHESION: SHX5961

## 2020-08-05 HISTORY — PX: CYSTOSCOPY WITH STENT PLACEMENT: SHX5790

## 2020-08-05 HISTORY — PX: FLEXIBLE SIGMOIDOSCOPY: SHX5431

## 2020-08-05 LAB — POCT PREGNANCY, URINE: Preg Test, Ur: NEGATIVE

## 2020-08-05 SURGERY — HYSTERECTOMY, TOTAL, ROBOT-ASSISTED, LAPAROSCOPIC, WITH BILATERAL SALPINGO-OOPHORECTOMY
Anesthesia: General | Site: Urethra

## 2020-08-05 MED ORDER — FENTANYL CITRATE (PF) 100 MCG/2ML IJ SOLN
INTRAMUSCULAR | Status: AC
  Filled 2020-08-05: qty 2

## 2020-08-05 MED ORDER — FENTANYL CITRATE (PF) 100 MCG/2ML IJ SOLN
INTRAMUSCULAR | Status: DC | PRN
Start: 2020-08-05 — End: 2020-08-05
  Administered 2020-08-05 (×9): 50 ug via INTRAVENOUS

## 2020-08-05 MED ORDER — HYDROMORPHONE HCL 1 MG/ML IJ SOLN
INTRAMUSCULAR | Status: AC
  Filled 2020-08-05: qty 1

## 2020-08-05 MED ORDER — BUPIVACAINE HCL (PF) 0.25 % IJ SOLN
INTRAMUSCULAR | Status: DC | PRN
  Administered 2020-08-05: 20 mL

## 2020-08-05 MED ORDER — PROPOFOL 10 MG/ML IV BOLUS
INTRAVENOUS | Status: AC
  Filled 2020-08-05: qty 20

## 2020-08-05 MED ORDER — ROCURONIUM BROMIDE 10 MG/ML (PF) SYRINGE
PREFILLED_SYRINGE | INTRAVENOUS | Status: AC
  Filled 2020-08-05: qty 10

## 2020-08-05 MED ORDER — ONDANSETRON HCL 4 MG PO TABS: 4.0000 mg | ORAL_TABLET | Freq: Four times a day (QID) | ORAL | Status: DC | PRN

## 2020-08-05 MED ORDER — MENTHOL 3 MG MT LOZG
1.0000 | LOZENGE | OROMUCOSAL | Status: DC | PRN
  Filled 2020-08-05: qty 9

## 2020-08-05 MED ORDER — CEFAZOLIN SODIUM-DEXTROSE 2-4 GM/100ML-% IV SOLN
INTRAVENOUS | Status: AC
  Filled 2020-08-05: qty 100

## 2020-08-05 MED ORDER — LACTATED RINGERS IV SOLN
INTRAVENOUS | Status: DC
  Administered 2020-08-05: 10 mL via INTRAVENOUS

## 2020-08-05 MED ORDER — OXYCODONE HCL 5 MG PO TABS: 5.0000 mg | ORAL_TABLET | Freq: Once | ORAL | Status: DC | PRN

## 2020-08-05 MED ORDER — PANTOPRAZOLE SODIUM 40 MG PO TBEC
DELAYED_RELEASE_TABLET | ORAL | Status: AC
  Filled 2020-08-05: qty 1

## 2020-08-05 MED ORDER — OXYCODONE HCL 5 MG PO TABS
ORAL_TABLET | ORAL | Status: AC
  Filled 2020-08-05: qty 2

## 2020-08-05 MED ORDER — DEXAMETHASONE SODIUM PHOSPHATE 4 MG/ML IJ SOLN
INTRAMUSCULAR | Status: DC | PRN
  Administered 2020-08-05: 10 mg via INTRAVENOUS

## 2020-08-05 MED ORDER — LIDOCAINE HCL (CARDIAC) PF 100 MG/5ML IV SOSY
PREFILLED_SYRINGE | INTRAVENOUS | Status: DC | PRN
  Administered 2020-08-05: 60 mg via INTRAVENOUS

## 2020-08-05 MED ORDER — HYDROMORPHONE HCL 1 MG/ML IJ SOLN
0.2500 mg | INTRAMUSCULAR | Status: DC | PRN
  Administered 2020-08-05 (×3): 0.25 mg via INTRAVENOUS
  Administered 2020-08-05 (×2): 0.5 mg via INTRAVENOUS
  Administered 2020-08-05: 0.25 mg via INTRAVENOUS

## 2020-08-05 MED ORDER — ONDANSETRON HCL 4 MG/2ML IJ SOLN
4.0000 mg | Freq: Four times a day (QID) | INTRAMUSCULAR | Status: DC | PRN
  Administered 2020-08-07: 4 mg via INTRAVENOUS
  Filled 2020-08-05: qty 2

## 2020-08-05 MED ORDER — SIMETHICONE 80 MG PO CHEW
80.0000 mg | CHEWABLE_TABLET | Freq: Four times a day (QID) | ORAL | Status: DC | PRN
  Administered 2020-08-06 – 2020-08-07 (×6): 80 mg via ORAL
  Filled 2020-08-05 (×4): qty 1

## 2020-08-05 MED ORDER — ONDANSETRON HCL 4 MG/2ML IJ SOLN
4.0000 mg | Freq: Once | INTRAMUSCULAR | Status: AC | PRN
  Administered 2020-08-05: 4 mg via INTRAVENOUS

## 2020-08-05 MED ORDER — SODIUM CHLORIDE 0.9 % IV SOLN
INTRAVENOUS | Status: DC | PRN
  Administered 2020-08-05: 40 mL

## 2020-08-05 MED ORDER — OXYCODONE HCL 5 MG/5ML PO SOLN: 5.0000 mg | Freq: Once | ORAL | Status: DC | PRN

## 2020-08-05 MED ORDER — OXYCODONE HCL 5 MG PO TABS
5.0000 mg | ORAL_TABLET | ORAL | Status: DC | PRN
  Administered 2020-08-05 – 2020-08-06 (×3): 10 mg via ORAL
  Filled 2020-08-05 (×2): qty 2

## 2020-08-05 MED ORDER — HYDROMORPHONE HCL 1 MG/ML IJ SOLN
0.5000 mg | INTRAMUSCULAR | Status: DC | PRN
  Administered 2020-08-06 (×14): 0.5 mg via INTRAVENOUS
  Filled 2020-08-05 (×5): qty 0.5

## 2020-08-05 MED ORDER — LABETALOL HCL 5 MG/ML IV SOLN
INTRAVENOUS | Status: AC
  Filled 2020-08-05: qty 4

## 2020-08-05 MED ORDER — LIDOCAINE 2% (20 MG/ML) 5 ML SYRINGE
INTRAMUSCULAR | Status: AC
  Filled 2020-08-05: qty 5

## 2020-08-05 MED ORDER — CEFAZOLIN SODIUM-DEXTROSE 2-4 GM/100ML-% IV SOLN
2.0000 g | INTRAVENOUS | Status: AC
  Administered 2020-08-05 (×2): 2 g via INTRAVENOUS

## 2020-08-05 MED ORDER — LABETALOL HCL 5 MG/ML IV SOLN
INTRAVENOUS | Status: DC | PRN
  Administered 2020-08-05: 10 mg via INTRAVENOUS
  Administered 2020-08-05: 5 mg via INTRAVENOUS

## 2020-08-05 MED ORDER — ONDANSETRON HCL 4 MG/2ML IJ SOLN
INTRAMUSCULAR | Status: AC
  Filled 2020-08-05: qty 2

## 2020-08-05 MED ORDER — POVIDONE-IODINE 10 % EX SWAB
2.0000 "application " | Freq: Once | CUTANEOUS | Status: AC
  Administered 2020-08-05: 2 via TOPICAL

## 2020-08-05 MED ORDER — PANTOPRAZOLE SODIUM 40 MG PO TBEC
40.0000 mg | DELAYED_RELEASE_TABLET | Freq: Every day | ORAL | Status: DC
  Administered 2020-08-05 – 2020-08-08 (×4): 40 mg via ORAL
  Filled 2020-08-05 (×3): qty 1

## 2020-08-05 MED ORDER — CEFAZOLIN SODIUM-DEXTROSE 2-4 GM/100ML-% IV SOLN
2.0000 g | Freq: Four times a day (QID) | INTRAVENOUS | Status: AC
  Administered 2020-08-05 – 2020-08-06 (×4): 2 g via INTRAVENOUS
  Filled 2020-08-05 (×4): qty 100

## 2020-08-05 MED ORDER — HYDROMORPHONE HCL 1 MG/ML IJ SOLN
0.2000 mg | INTRAMUSCULAR | Status: DC | PRN
  Administered 2020-08-05 (×2): 0.5 mg via INTRAVENOUS

## 2020-08-05 MED ORDER — PROPOFOL 10 MG/ML IV BOLUS
INTRAVENOUS | Status: DC | PRN
  Administered 2020-08-05 (×2): 50 mg via INTRAVENOUS
  Administered 2020-08-05: 160 mg via INTRAVENOUS
  Administered 2020-08-05 (×2): 50 mg via INTRAVENOUS
  Administered 2020-08-05: 40 mg via INTRAVENOUS

## 2020-08-05 MED ORDER — ONDANSETRON HCL 4 MG/2ML IJ SOLN
INTRAMUSCULAR | Status: DC | PRN
  Administered 2020-08-05: 4 mg via INTRAVENOUS

## 2020-08-05 MED ORDER — SODIUM CHLORIDE 0.9 % IV SOLN
INTRAVENOUS | Status: DC | PRN
  Administered 2020-08-05: 10 mL

## 2020-08-05 MED ORDER — SUGAMMADEX SODIUM 200 MG/2ML IV SOLN
INTRAVENOUS | Status: DC | PRN
  Administered 2020-08-05: 200 mg via INTRAVENOUS

## 2020-08-05 MED ORDER — FENTANYL CITRATE (PF) 250 MCG/5ML IJ SOLN
INTRAMUSCULAR | Status: AC
  Filled 2020-08-05: qty 5

## 2020-08-05 MED ORDER — ROCURONIUM BROMIDE 100 MG/10ML IV SOLN
INTRAVENOUS | Status: DC | PRN
  Administered 2020-08-05: 20 mg via INTRAVENOUS
  Administered 2020-08-05: 50 mg via INTRAVENOUS
  Administered 2020-08-05 (×5): 10 mg via INTRAVENOUS

## 2020-08-05 MED ORDER — DEXAMETHASONE SODIUM PHOSPHATE 10 MG/ML IJ SOLN
INTRAMUSCULAR | Status: AC
  Filled 2020-08-05: qty 1

## 2020-08-05 MED ORDER — MIDAZOLAM HCL 2 MG/2ML IJ SOLN
INTRAMUSCULAR | Status: AC
  Filled 2020-08-05: qty 2

## 2020-08-05 MED ORDER — MEPERIDINE HCL 25 MG/ML IJ SOLN: 6.2500 mg | INTRAMUSCULAR | Status: DC | PRN

## 2020-08-05 MED ORDER — MIDAZOLAM HCL 5 MG/5ML IJ SOLN
INTRAMUSCULAR | Status: DC | PRN
  Administered 2020-08-05: 2 mg via INTRAVENOUS

## 2020-08-05 SURGICAL SUPPLY — 92 items
APPLICATOR ARISTA FLEXITIP XL (MISCELLANEOUS) ×2 IMPLANT
CATH FOLEY 3WAY  5CC 18FR (CATHETERS) ×6
CATH FOLEY 3WAY 5CC 18FR (CATHETERS) IMPLANT
CATH ROBINSON RED A/P 20FR (CATHETERS) ×2 IMPLANT
CATH URET 5FR 28IN OPEN ENDED (CATHETERS) ×2 IMPLANT
CHLORAPREP W/TINT 26 (MISCELLANEOUS) ×8 IMPLANT
COVER BACK TABLE 60X90IN (DRAPES) ×6 IMPLANT
COVER SURGICAL LIGHT HANDLE (MISCELLANEOUS) ×2 IMPLANT
COVER TIP SHEARS 8 DVNC (MISCELLANEOUS) ×4 IMPLANT
COVER TIP SHEARS 8MM DA VINCI (MISCELLANEOUS) ×6
COVER WAND RF STERILE (DRAPES) ×6 IMPLANT
DECANTER SPIKE VIAL GLASS SM (MISCELLANEOUS) ×12 IMPLANT
DEFOGGER SCOPE WARMER CLEARIFY (MISCELLANEOUS) ×6 IMPLANT
DERMABOND ADVANCED (GAUZE/BANDAGES/DRESSINGS) ×4
DERMABOND ADVANCED .7 DNX12 (GAUZE/BANDAGES/DRESSINGS) ×4 IMPLANT
DILATOR CANAL MILEX (MISCELLANEOUS) ×2 IMPLANT
DRAPE ARM DVNC X/XI (DISPOSABLE) ×16 IMPLANT
DRAPE C-ARM 42X120 X-RAY (DRAPES) ×2 IMPLANT
DRAPE COLUMN DVNC XI (DISPOSABLE) ×4 IMPLANT
DRAPE DA VINCI XI ARM (DISPOSABLE) ×24
DRAPE DA VINCI XI COLUMN (DISPOSABLE) ×6
DRAPE UTILITY XL STRL (DRAPES) ×6 IMPLANT
DRSG VASELINE 3X18 (GAUZE/BANDAGES/DRESSINGS) ×2 IMPLANT
ELECT REM PT RETURN 9FT ADLT (ELECTROSURGICAL) ×6
ELECTRODE REM PT RTRN 9FT ADLT (ELECTROSURGICAL) ×4 IMPLANT
GAUZE 4X4 16PLY RFD (DISPOSABLE) ×6 IMPLANT
GAUZE SPONGE 4X4 12PLY STRL LF (GAUZE/BANDAGES/DRESSINGS) ×2 IMPLANT
GLOVE BIO SURGEON STRL SZ 6.5 (GLOVE) ×17 IMPLANT
GLOVE BIO SURGEON STRL SZ7 (GLOVE) ×2 IMPLANT
GLOVE BIO SURGEON STRL SZ7.5 (GLOVE) ×4 IMPLANT
GLOVE BIO SURGEONS STRL SZ 6.5 (GLOVE) ×5
GLOVE BIOGEL PI IND STRL 7.0 (GLOVE) ×20 IMPLANT
GLOVE BIOGEL PI INDICATOR 7.0 (GLOVE) ×14
GLOVE ECLIPSE 6.5 STRL STRAW (GLOVE) ×2 IMPLANT
GOWN STRL REUS W/ TWL LRG LVL3 (GOWN DISPOSABLE) ×12 IMPLANT
GOWN STRL REUS W/ TWL XL LVL3 (GOWN DISPOSABLE) IMPLANT
GOWN STRL REUS W/TWL LRG LVL3 (GOWN DISPOSABLE) ×24
GOWN STRL REUS W/TWL XL LVL3 (GOWN DISPOSABLE) ×6
GUIDEWIRE ZIPWRE .038 STRAIGHT (WIRE) ×2 IMPLANT
HEMOSTAT ARISTA ABSORB 3G PWDR (HEMOSTASIS) ×2 IMPLANT
HIBICLENS CHG 4% 4OZ (MISCELLANEOUS) ×6 IMPLANT
HOLDER FOLEY CATH W/STRAP (MISCELLANEOUS) ×2 IMPLANT
IRRIG SUCT STRYKERFLOW 2 WTIP (MISCELLANEOUS) ×6
IRRIGATION SUCT STRKRFLW 2 WTP (MISCELLANEOUS) ×4 IMPLANT
IV NS IRRIG 3000ML ARTHROMATIC (IV SOLUTION) ×6 IMPLANT
KIT SIGMOIDOSCOPE (SET/KITS/TRAYS/PACK) ×2 IMPLANT
KIT TURNOVER CYSTO (KITS) ×6 IMPLANT
LEGGING LITHOTOMY PAIR STRL (DRAPES) ×6 IMPLANT
MANIFOLD NEPTUNE II (INSTRUMENTS) ×2 IMPLANT
NEEDLE HYPO 22GX1.5 SAFETY (NEEDLE) ×6 IMPLANT
NS IRRIG 1000ML POUR BTL (IV SOLUTION) ×6 IMPLANT
OBTURATOR OPTICAL STANDARD 8MM (TROCAR) ×6
OBTURATOR OPTICAL STND 8 DVNC (TROCAR) ×4
OBTURATOR OPTICALSTD 8 DVNC (TROCAR) ×4 IMPLANT
OCCLUDER COLPOPNEUMO (BALLOONS) ×6 IMPLANT
PACK ABDOMINAL GYN (CUSTOM PROCEDURE TRAY) ×6 IMPLANT
PACK ROBOT WH (CUSTOM PROCEDURE TRAY) ×6 IMPLANT
PACK ROBOTIC GOWN (GOWN DISPOSABLE) ×6 IMPLANT
PACK TRENDGUARD 450 HYBRID PRO (MISCELLANEOUS) IMPLANT
PAD ARMBOARD 7.5X6 YLW CONV (MISCELLANEOUS) ×6 IMPLANT
PAD OB MATERNITY 4.3X12.25 (PERSONAL CARE ITEMS) ×6 IMPLANT
PAD PREP 24X48 CUFFED NSTRL (MISCELLANEOUS) ×6 IMPLANT
PROTECTOR NERVE ULNAR (MISCELLANEOUS) ×12 IMPLANT
RTRCTR C-SECT PINK 25CM LRG (MISCELLANEOUS) ×6 IMPLANT
SEAL CANN UNIV 5-8 DVNC XI (MISCELLANEOUS) ×16 IMPLANT
SEAL XI 5MM-8MM UNIVERSAL (MISCELLANEOUS) ×24
SEALER VESSEL DA VINCI XI (MISCELLANEOUS) ×6
SEALER VESSEL EXT DVNC XI (MISCELLANEOUS) ×4 IMPLANT
SET TRI-LUMEN FLTR TB AIRSEAL (TUBING) ×6 IMPLANT
STENT URET 6FRX24 CONTOUR (STENTS) ×2 IMPLANT
SUT MON AB 4-0 PS1 27 (SUTURE) ×6 IMPLANT
SUT VIC AB 0 CT1 18XCR BRD8 (SUTURE) ×8 IMPLANT
SUT VIC AB 0 CT1 27 (SUTURE) ×12
SUT VIC AB 0 CT1 27XBRD ANBCTR (SUTURE) ×8 IMPLANT
SUT VIC AB 0 CT1 36 (SUTURE) ×2 IMPLANT
SUT VIC AB 0 CT1 8-18 (SUTURE) ×12
SUT VIC AB 2-0 CT1 27 (SUTURE) ×6
SUT VIC AB 2-0 CT1 TAPERPNT 27 (SUTURE) ×4 IMPLANT
SUT VIC AB 4-0 PS2 27 (SUTURE) ×12 IMPLANT
SUT VICRYL 0 UR6 27IN ABS (SUTURE) ×6 IMPLANT
SUT VICRYL 1 TIES 12X18 (SUTURE) ×6 IMPLANT
SUT VLOC 180 0 9IN  GS21 (SUTURE) ×12
SUT VLOC 180 0 9IN GS21 (SUTURE) ×4 IMPLANT
SYR BULB IRRIG 60ML STRL (SYRINGE) ×2 IMPLANT
SYR CONTROL 10ML LL (SYRINGE) ×6 IMPLANT
TIP UTERINE 5.1X6CM LAV DISP (MISCELLANEOUS) ×2 IMPLANT
TIP UTERINE 6.7X8CM BLUE DISP (MISCELLANEOUS) ×2 IMPLANT
TOWEL OR 17X26 10 PK STRL BLUE (TOWEL DISPOSABLE) ×10 IMPLANT
TRAY FOLEY W/BAG SLVR 14FR LF (SET/KITS/TRAYS/PACK) ×6 IMPLANT
TRENDGUARD 450 HYBRID PRO PACK (MISCELLANEOUS) ×6
TROCAR PORT AIRSEAL 5X120 (TROCAR) ×2 IMPLANT
TROCAR PORT AIRSEAL 8X120 (TROCAR) ×6 IMPLANT

## 2020-08-05 NOTE — Op Note (Signed)
08/05/2020  7:07 PM  PATIENT:  Crystal LevanAmanda F Jablon  44 y.o. female  PRE-OPERATIVE DIAGNOSIS:  Menorrhagia, Pelvic Pain, Pelvic Mass, chronic tubo-ovarian abscess  POST-OPERATIVE DIAGNOSIS:  Menorrhagia, Pelvic Pain, Pelvic Mass, chronic tubo-ovarian abscess  PROCEDURE:  Procedure(s): XI ROBOTIC ASSISTED TOTAL HYSTERECTOMY WITH Left SALPINGO OOPHORECTOMY/Right Salpingectomy (N/A) CYSTOSCOPY WITH STENT PLACEMENT, RETROGRADE PYELOGRAM (N/A) RIGID SIGMOIDOSCOPY (N/A)  Enterolysis,  Left ureterolyis  SURGEON:  Surgeon(s) and Role: Panel 1:    * Noland FordyceFogleman, Alauna Hayden, MD - Primary    * Shea EvansMody, Vaishali, MD - Assisting Panel 2:    * Winter, Dorian Furnacehristopher Aaron, MD - Primary Panel 3    * Griselda Mineroth, Paul III, MD   PHYSICIAN ASSISTANT: None  ASSISTANTS: As above  ANESTHESIA:   local and general  EBL:  100 mL   BLOOD ADMINISTERED:none  DRAINS: Urinary Catheter (Foley) and Left ureteral stent   LOCAL MEDICATIONS USED:  MARCAINE    and BUPIVICAINE   SPECIMEN:  Source of Specimen:  Right fallopian tube, left tube and ovary with abscess cavity, uterus and cervix  DISPOSITION OF SPECIMEN:  PATHOLOGY  COUNTS:  YES  TOURNIQUET:  * No tourniquets in log *  DICTATION: .Note written in EPIC  PLAN OF CARE: Admit for overnight observation  PATIENT DISPOSITION:  PACU - hemodynamically stable.   Delay start of Pharmacological VTE agent (>24hrs) due to surgical blood loss or risk of bleeding: yes   Procedure:Robotic-assisted total laparoscopic hysterectomy with right salpingectomy and left salpingo-oophorectomy.  Significant enterolysis, ureterolyis Complications: none Antibiotics: 2 g Ancef Findings: nl right tube and ovary, dilated left fallopian tube, large left adnexal mass involving the ureter and the left ovary and abscess cavity, enlarged fibroid uterus, normal but friable cervix, normal liver edge, normal gallbladder,normal appendix.left stented ureter running through the large abscess cavity.   Rectum densely adhesed to the left pelvic mass and the posterior uterus.  Rectal adhesions carried across the midline to the right side of the lower uterine segment.  Hemostasis post-procedure with peristalsis of right ureter, left ureter with stent in place.  Indications: This is a 44 year old nonpregnant G3, P2 with no desire for future fertility.  Patient with persistent pelvic pain and pelvic mass most consistent with chronic left-sided tubo-ovarian abscess.  Patient here for robotic assisted total laparoscopic hysterectomy with right salpingectomy and left salpingo-oophorectomy.  Patient also with a plan for preoperative stent placement due to the expected need for ureterolysis based on the CT findings.   Procedure: After informed consent obtained, the patient was taken to the operating room where general anesthesia was initiated without difficulty.   She was prepped and draped in normal sterile fashion in the dorsal supine lithotomy position.  Dr. Liliane ShiWinter began the case with fluoroscopy guided left ureteral stent placement.  See his notes for details.  He also placed a Foley catheter at that time.  A bimanual examination was done to assess the size and position of the uterus. A weighted speculum was placed in the vagina and deaver retractors were used on the anterior vaginal wall. .The cervix was grasped with tenaculum. The cervix was sounded to 6 cm.  Due to the prior endometrial ablation no clear uterine cavity was identified and the dilation was slow due to adhesions. Stay sutures were placed in the anterior posterior cervical walls.  Of note the posterior sutures continue to pull through given the friability of the posterior cervix.  Eventually a suture stayed. The cervix was assessed to identify the Rumi-Co size.  A medium cup and an 6 cm shaft was used.  However the uterine balloon continue to push out through the cervix.  At this point it became clear that I would need to use a 8 cm shaft.   Additional dilation was done to break up adhesions of the endometrial cavity in order to allow an 8 cm shaft to be placed.  The uterine balloon was inflated.  Gloved were changed. Attention was then turned to the patient's abdomen. 0.5 % marcaine was used prior to all incision. A total of 10 cc of marcaine was used.  A 10 mm incision was made in the umbilicus and blunt and sharp dissection was done until the fascia was identified. This was then grasped with Kocher clamps x2 and entered sharply. A pursestring suture of 0 Vicryl was then placed along the incision and a non-bladed Roseanne Reno was inserted into the peritoneal cavity. Intraperitoneal placement was confirmed with the use of the camera and pneumoperitoneum was created to 15 mm of mercury. The pursestring suture was secured around the port and pneumoperitoneum was maintained. Brief survey of the abdomen and pelvis was done with findings as above. The abdominal wall was assessed and additional port sites were marked. 8 mm incisions were placed in the right (two) and left lower quadrants (2) and non-bladed ports were placed under direct visualization. The robot was brought to the patient's side and attached with the right side docking. The robotic instruments were placed under direct visualization until proper placement just over the uterus.  I then went to the robotic console. Brief survey of the patient's abdomen and pelvis revealed  findings as above.  Given the density of the adhesions in the left pelvis I decided to start on right side.  The right ureter and infundibulopelvic ligament were identified.  The ureter was seen low in the pelvis at the level of the infundibulopelvic ligament however coursed close to the dense rectal adhesions.  Starting at the fimbriated end of the right fallopian tube and staying clear of the right IP ligament serial cautery and transection were done along the right mesosalpinx.  The right round ligament was identified  cauterized and transected.  Separating the anterior and posterior leafs of the broad ligament and was done with monopolar scissors.  Dissection both blunt and with monopolar scissors of the anterior leaf of the broad ligament was carried through to create a bladder flap.  Similar dissection was done posteriorly to further drop the ureter.  During this dissection I encountered the denser adhesions.  Great care was taken to dissect the posterior leaf the broad ligament and dropped the ureter.  During the dissection the freed fallopian tube continued to impede visualization.  The right fallopian tube was transected and removed continued dissection on the posterior leaf of the broad ligament took [place] to rectal adhesions on to the posterior uterine wall and these were slowly dissected.  I then turned to the left side.  The large left pelvic mass had rectum running into it.  This was slowly and bluntly dissected.  Some Hydrodissection was used as well.  It was still quite dense and mobility was limited.  Then decided to start at the left round ligament which was serially cauterized and transected.  The anterior leaf of the broad ligament was carried out with both blunt and monopolar cautery to create a bladder flap.  Some posterior broad ligament dissection was done as well however this was run into the large pelvic mass.  I did spend about an hour of enterolysis, continuing to find a vascular planes that can be separated with mostly blunt dissection and occasionally dissection with sharp scissors.  During this dissection the abscess cavity was entered and large amounts of chocolate brown bloody purulent fluid was  At this point I decided to transect the left adnexal mass from the uterus and proceed with hysterectomy.  Once adequately ensuring location of the left ureter and the left IP ligament the left fallopian tube was serially cauterized and transected.  The left utero-ovarian ligament was difficult to identify  as it was part of the large mass.  This was serially cauterized and transected.  Continued dissection along the left broad ligament was done.  Additional dissection was done on the bladder flap with inward pressure on the Rumi cup the left uterine artery was serially cauterized and transected.  Attention was turned to the right side of the uterus.  Additional dissection was done along the broad ligament and the right uterine arteries were serially cauterized and transected.  Additional dissection was done on the posterior lower uterine wall and posterior cervix to free the rectum.  With inward pressure on the Rumi, the anterior colpotomy was made with the monopolar scissors.  This was carried around to the patient's left side.  This was then carried around to the patient's right side and then the posterior colpotomy was made.  Hemostasis was noted the uterus was free and removed through the vagina.  The occluder was in place in the vagina.  At this point we were able to retract the pelvic mass laterally for cautious dissection.  The left ureteral stent was identified and left ureterolysis was done.  During this process Dr. Liliane Shi was called back in for consult.  At this point he did not scrub but gave his consult as to dissection of the ureter.  Once it was felt the ureter was free from the pelvic mass the left infundibulopelvic ligament was serially cauterized and transected.  The mass was then free and pushed out through the vagina.   Copious irrigation and evaluation of the pelvis was then done.  There was an area in the rectum where it was unclear if a small defect was present.  We called for surgical consult.  In the time that it took for the surgical consultant to arrive the vaginal cuff was closed with a 9 inch V-Loc.  The suture was placed through the lateral port, starting at the right angle running suture was done across to the left angle.  An additional V-Loc suture was used to close the left angle  and then come back across. to the midline.  The sutures were removed.   Hemostasis was noted.  Right ureter was peristalsing.  Left ureter with stent could be tracked.  While still awaiting surgical consult we decided to fill the lower pelvis with saline and place a red rubber catheter into the rectum through this catheter we were pushing air into the rectum to further evaluate for any defect in the rectum.  Surgery consult then arrived and proceeded with a rigid proctoscope.  See Dr. Billey Chang note for further details.  Per his proctoscopic exam no defects were noted, no blood was noted in the rectum and with attempting to push air through the proctoscope into a pelvis filled with saline no air leak was noted.  He was competent there was no defect in the rectum.  The robot was then undocked after instruments were removed  and the patient was removed from Trendelenburg position and fluid was removed from the pelvis.  Arista was placed on the raw edges from the enterolysis and ureterolysis.  Good hemostasis was noted.  10 cc of bupivacaine was placed in the upper abdomen.  Limited the amount of bupivacaine because we did not want to wash away the Arista.     The  pursestring suture at the umbilicus was then closed.    No additional fascial incisions were closed due to the small size and the nondilated nature of these incisions. A 4-0 Vicryl was used to close the additional laparoscopic ports sites. The four 8 mm laparoscopic port incisions were closed in subcuticular fashion. Good hemostasis was noted.  Sponge lap and needle counts were correct x3 the patient was woken from general anesthesia having tolerated the procedure well and taken to the recovery room in a stable fashion.

## 2020-08-05 NOTE — Consult Note (Signed)
Reason for Consult:possible bowel injury Referring Physician: Dr. Boris Lown is an 44 y.o. female.  HPI: The patient is a 44 year old female who was in the operating room for a robotic hysterectomy and drainage of a chronic tubo-ovarian abscess.  There was a lot of scar tissue in the pelvis.  During the dissection there was concern for a possible rectal injury.  General surgery was consulted intraoperatively.  The patient was intubated and I could not perform a thorough exam or review of systems.  Past Medical History:  Diagnosis Date  . Anxiety   . Gestational diabetes    Diet controlled  . Hypothyroidism    Hashimoto's  . SVD (spontaneous vaginal delivery) 11/18/2013  . SVD (spontaneous vaginal delivery) (12/27) 11/18/2013    Past Surgical History:  Procedure Laterality Date  . ABLATION  08/2019   ovarian  . WISDOM TOOTH EXTRACTION      Family History  Problem Relation Age of Onset  . Cancer Paternal Aunt        breast  . Diabetes Paternal Grandmother   . Cancer Paternal Grandmother        pancreatic cancer    Social History:  reports that she has never smoked. She has never used smokeless tobacco. She reports that she does not drink alcohol and does not use drugs.  Allergies: No Known Allergies  Medications: I have reviewed the patient's current medications.  Results for orders placed or performed during the hospital encounter of 08/05/20 (from the past 48 hour(s))  Pregnancy, urine POC     Status: None   Collection Time: 08/05/20 11:07 AM  Result Value Ref Range   Preg Test, Ur NEGATIVE NEGATIVE    Comment:        THE SENSITIVITY OF THIS METHODOLOGY IS >24 mIU/mL     DG C-Arm 1-60 Min-No Report  Result Date: 08/05/2020 Fluoroscopy was utilized by the requesting physician.  No radiographic interpretation.    Review of Systems  Unable to perform ROS: Intubated   Blood pressure (!) 160/96, pulse 74, temperature 99 F (37.2 C), temperature  source Oral, resp. rate 18, height 5\' 3"  (1.6 m), weight 80.8 kg, SpO2 100 %, unknown if currently breastfeeding. Physical Exam Constitutional:      General: She is not in acute distress.    Comments: Intubated on the operating room table  Cardiovascular:     Rate and Rhythm: Normal rate and regular rhythm.  Abdominal:     Comments: Undergoing robotic surgery in the pelvis  Skin:    General: Skin is warm and dry.     Assessment/Plan: There was concern intraoperatively for a possible bowel injury in the rectum.  I performed a rigid sigmoidoscopy.  No injuries were appreciated.  It did appear as though we were able to drive the scope beyond the area of concern.  We also filled the pelvis with saline as we insufflated the rectum and no bubbling was noted either.  At this point the primary service is planning to admit the patient for observation and we will follow along.  III 08/05/2020, 6:44 PM

## 2020-08-05 NOTE — Op Note (Signed)
08/05/2020  6:39 PM  PATIENT:  Crystal Wiley  44 y.o. female  PRE-OPERATIVE DIAGNOSIS:  Menorrhagia, Pelvic Pain, Pelvic Mass  POST-OPERATIVE DIAGNOSIS:  Menorrhagia, Pelvic Pain, Pelvic Mass  PROCEDURE:  Procedure(s): RIGID SIGMOIDOSCOPY  SURGEON:  Surgeon(s) and Role: Chevis Pretty, MD   PHYSICIAN ASSISTANT:   ASSISTANTS: none   ANESTHESIA:   general  EBL:  minimal   BLOOD ADMINISTERED:none  DRAINS: none   LOCAL MEDICATIONS USED:  NONE  SPECIMEN:  No Specimen  DISPOSITION OF SPECIMEN:  N/A  COUNTS:  YES  TOURNIQUET:  * No tourniquets in log *  DICTATION: .Dragon Dictation   An intraoperative consult was requested by the gynecologists to help rule out a rectal injury.  There was significant the scar tissue in the pelvis during the operation.  There was concern for a possible rectal injury.  At this point I performed a rigid sigmoidoscopy to evaluate the rectum.  We did not identify any mucosal defects and it appeared as though we were able to drive the rigid sigmoidoscope above the area in question.  We also filled the pelvis with saline as we insufflated the rectum and there was no evidence of bubbling.  At this point we could not identify anything that looked like a rectal injury.  The patient will be admitted after the surgical procedure for close observation.  At the end of this portion of the procedure the patient was tolerating the operation well.  All needle sponge and instrument counts were correct.  PLAN OF CARE: Admit for overnight observation  PATIENT DISPOSITION:  PACU - hemodynamically stable.   Delay start of Pharmacological VTE agent (>24hrs) due to surgical blood loss or risk of bleeding: no

## 2020-08-05 NOTE — H&P (Signed)
Chief complaint: Pelvic pain, pelvic mass, likely chronic tubo-ovarian abscess  History of present illness 44 year old G3 P2-0-1-2 with vaginal delivery x2 presented several months ago with persistent left-sided pelvic pain.  Patient had office-based NovaSure endometrial ablation October 2024 persistent menorrhagia.  Patient did have a normal endometrial biopsy and sonohysterogram preoperatively and is up-to-date on a normal Pap smear.  2 weeks after her ablation patient was treated for an endometritis.  We then did not hear from her for several months though she reported 3 urinary tract infections prior to presenting to our office this summer.  Patient notes persistent left flank pain, that runs to her shoulder and often causes GI cramping.  Patient notes frequent sensation of having to have bowel movement but does not have bowel movement.  Patient notes persistent dyspareunia.  Patient had a CT scan June 21, 2020 noting a complex thick-walled cyst in the left hemipelvis 5.2 x 6.3 favors infectious etiology like tubo-ovarian abscess or pyosalpinx but CT scan did report a fullness of the left ureter as it passes through the process in the left adnexa.  The appendix was normal and there was no evidence of any diverticular disease.  Patient just prior to that had an ultrasound July 26 which showed the uterus 8 x 5 x 5 cm with a 3 mm endometrial stripe and the elongated structure in the left adnexa posterior to the left ovary measuring 4.8 x 3.9 x 1.4 cm in addition the left ovary had a complex cystic mass 5 x 4.2 x 4.1 cm of note back in August 2020 the left ovary had the appearance of a hemorrhagic cyst with a complex cyst at 4.1 x 2.6 x 3.3 cm.  This was prior to the ablation.  After the CT scan resulted patient did a 2-week course of treatment for tubo-ovarian abscess with no significant change in her pain and no significant decrease in the size of the mass.  Repeat ultrasound on 823 shows size actually slightly  increased at 5.2 x 5.2 x 5.0 with some peripheral blood flow and a left tube complex and fluid-filled at 3.8 x 3.2 x 2.2 cm.  Given the persistent pain requiring nightly narcotics and suspicion for chronic tubo-ovarian abscess patient presents for surgical management.  Given that she still has monthly menses despite the ablation we are planning to do a hysterectomy as well.  To prevent any problems with the right adnexa we plan a right salpingectomy but will leave the right ovary.  Past Medical History:  Diagnosis Date  . Anxiety   . Gestational diabetes    Diet controlled  . Hypothyroidism    Hashimoto's  . SVD (spontaneous vaginal delivery) 11/18/2013  . SVD (spontaneous vaginal delivery) (12/27) 11/18/2013    Past Surgical History:  Procedure Laterality Date  . ABLATION  08/2019   ovarian  . WISDOM TOOTH EXTRACTION      PE Vitals with BMI 08/05/2020 07/25/2020 06/20/2020  Height 5\' 3"  5\' 3"  -  Weight 178 lbs 3 oz 185 lbs 185 lbs  BMI 31.57 32.78 -  Systolic 160 149  Diastolic 96 91 82  Pulse 74 85 -   General: Well-appearing slightly anxious but no distress Abdomen: Soft, tender to deep palpation in the left lower quadrant, no rebound, no guarding GU: Deferred to the OR Lower extremities: Nontender, no edema  CBC    Component Value Date/Time   WBC 7.2 07/25/2020 0930   RBC 4.30 07/25/2020 0930   HGB 12.3 07/25/2020  0930   HCT 37.2 07/25/2020 0930   PLT 286 07/25/2020 0930   MCV 86.5 07/25/2020 0930   MCH 28.6 07/25/2020 0930   MCHC 33.1 07/25/2020 0930   RDW 14.1 07/25/2020 0930   LYMPHSABS 2.7 06/20/2020 1549   MONOABS 0.4 06/20/2020 1549   EOSABS 0.2 06/20/2020 1549   BASOSABS 0.0 06/20/2020 1549     Assessment plan: 44 year old nonpregnant G3, P2 with likely chronic left-sided tubo-ovarian abscess and persistent menorrhagia despite endometrial ablation.  Patient presents for attempted robotic assisted total laparoscopic hysterectomy with left  salpingo-oophorectomy and right salpingectomy with right ovarian retention.  Given the complexity of the mass in the left adnexa patient is aware of the risk to her left ureter.  For this urology will stent her ureters at the start of the case.  Patient is aware that this does not remove but only reduces risk of ureteral damage.  Patient is aware we may need to convert to exploratory laparotomy and maybe need general surgery or urology assistance Intra-Op.  Patient is aware of this is associated with increased risks and longer stay.  Lendon Colonel 08/05/2020 12:29 PM

## 2020-08-05 NOTE — Brief Op Note (Signed)
08/05/2020  7:07 PM  PATIENT:  Crystal Wiley  44 y.o. female  PRE-OPERATIVE DIAGNOSIS:  Menorrhagia, Pelvic Pain, Pelvic Mass, chronic tubo-ovarian abscess  POST-OPERATIVE DIAGNOSIS:  Menorrhagia, Pelvic Pain, Pelvic Mass, chronic tubo-ovarian abscess  PROCEDURE:  Procedure(s): XI ROBOTIC ASSISTED TOTAL HYSTERECTOMY WITH Left SALPINGO OOPHORECTOMY/Right Salpingectomy (N/A) CYSTOSCOPY WITH STENT PLACEMENT, RETROGRADE PYELOGRAM (N/A) RIGID SIGMOIDOSCOPY (N/A)  Entire lysis  SURGEON:  Surgeon(s) and Role: Panel 1:    * Noland Fordyce, MD - Primary    * Shea Evans, MD - Assisting Panel 2:    * Winter, Dorian Furnace, MD - Primary Panel 3    * Griselda Miner, MD   PHYSICIAN ASSISTANT: None  ASSISTANTS: As above  ANESTHESIA:   local and general  EBL:  100 mL   BLOOD ADMINISTERED:none  DRAINS: Urinary Catheter (Foley) and Left ureteral stent   LOCAL MEDICATIONS USED:  MARCAINE    and BUPIVICAINE   SPECIMEN:  Source of Specimen:  Right fallopian tube, left tube and ovary with abscess cavity, uterus and cervix  DISPOSITION OF SPECIMEN:  PATHOLOGY  COUNTS:  YES  TOURNIQUET:  * No tourniquets in log *  DICTATION: .Note written in EPIC  PLAN OF CARE: Admit for overnight observation  PATIENT DISPOSITION:  PACU - hemodynamically stable.   Delay start of Pharmacological VTE agent (>24hrs) due to surgical blood loss or risk of bleeding: yes

## 2020-08-05 NOTE — Transfer of Care (Signed)
Immediate Anesthesia Transfer of Care Note  Patient: Crystal Wiley  Procedure(s) Performed: XI ROBOTIC ASSISTED TOTAL HYSTERECTOMY WITH Left SALPINGO OOPHORECTOMY/Right Salpingectomy (N/A ) RIGID SIGMOIDOSCOPY (N/A Rectum) CYSTOSCOPY WITH STENT PLACEMENT, RETROGRADE PYELOGRAM (N/A Urethra)  Patient Location: PACU  Anesthesia Type:General  Level of Consciousness: awake, alert  and oriented  Airway & Oxygen Therapy: Patient Spontanous Breathing and Patient connected to nasal cannula oxygen  Post-op Assessment: Report given to RN and Post -op Vital signs reviewed and stable  Post vital signs: Reviewed and stable  Last Vitals:  Vitals Value Taken Time  BP 129/91 08/05/20 1903  Temp 36.3 C 08/05/20 1900  Pulse 88 08/05/20 1907  Resp 12 08/05/20 1907  SpO2 100 % 08/05/20 1907  Vitals shown include unvalidated device data.  Last Pain:  Vitals:   08/05/20 1145  TempSrc: Oral  PainSc: 6       Patients Stated Pain Goal: 2 (08/05/20 1145)  Complications: No complications documented.

## 2020-08-05 NOTE — Op Note (Signed)
Operative Note  Preoperative diagnosis:  1.  Left tubo-ovarian abscess  Postoperative diagnosis: 1.  Same  Procedure(s): 1.  Cystoscopy with left JJ stent placement 2.  Left retrograde pyelogram with intraoperative interpretation of fluoroscopic imaging  Surgeon: Rhoderick Moody, MD  Assistants:  None  Anesthesia:  General  Complications:  None  EBL: Less than 5 mL  Specimens: 1.  None  Drains/Catheters: 1.  Left 6 French, 24 cm JJ stent with tether secured to Foley catheter  Intraoperative findings:   1. Left retrograde pyelogram revealed an area of stenosis involving the distal aspects of the left with uniform dilation involving the proximal aspects of the left ureter and renal pelvis.  There were no other filling defects identified on retrograde pyelogram  Indication:  Crystal Wiley is a 44 y.o. female with a large left-sided tubo-ovarian abscess that is adjacent to her left ureter.  Dr. Algie Coffer has consulted me to place a ureteral stent to assist with ureteral identification intraoperatively.  The risk, benefits and alternatives of the above procedures were discussed in detail.  She voices understanding and wishes to proceed. Description of procedure:  After informed consent was obtained, the patient was brought to the operating room and general LMA anesthesia was administered. The patient was then placed in the dorsolithotomy position and prepped and draped in the usual sterile fashion. A timeout was performed. A 23 French rigid cystoscope was then inserted into the urethral meatus and advanced into the bladder under direct vision. A complete bladder survey revealed no intravesical pathology.  A 5 French ureteral catheter was then inserted into the left ureteral orifice and a retrograde pyelogram was obtained, with the findings listed above.  A Glidewire was then used to intubate the lumen of the ureteral catheter and was advanced up to the left renal pelvis, under  fluoroscopic guidance.  The catheter was then removed, leaving the wire in place.  A 6 French, 24 cm JJ stent was then placed over the wire and into good position within the left collecting system, confirming placement via fluoroscopy.  A an 53 French three-way Foley catheter was then placed within the bladder with return of clear irrigant.  The Foley catheter was placed to gravity drainage.  The tether of the stent was then secured to the Foley catheter.  Dr. Algie Coffer then proceeded with her portion of the case.  Plan: Remove Foley catheter and left ureteral stent on POD1.

## 2020-08-05 NOTE — Progress Notes (Signed)
Dr. Ernestina Penna notified pt rec dilaudid 0.5 mg at 2038 and 2329 and oxy IR 10 mg po.  Orders received will continue to monitor.  Also notified of BP 156/99.

## 2020-08-05 NOTE — Anesthesia Procedure Notes (Signed)
Procedure Name: Intubation Date/Time: 08/05/2020 1:06 PM Performed by: Justice Rocher, CRNA Pre-anesthesia Checklist: Patient identified, Emergency Drugs available, Suction available, Patient being monitored and Timeout performed Patient Re-evaluated:Patient Re-evaluated prior to induction Oxygen Delivery Method: Circle system utilized Preoxygenation: Pre-oxygenation with 100% oxygen Induction Type: IV induction Ventilation: Mask ventilation without difficulty Laryngoscope Size: 3 and Mac Grade View: Grade II Tube type: Oral Tube size: 7.5 mm Number of attempts: 1 Airway Equipment and Method: Stylet and Oral airway Placement Confirmation: ETT inserted through vocal cords under direct vision,  positive ETCO2,  breath sounds checked- equal and bilateral and CO2 detector Secured at: 23 cm Tube secured with: Tape Dental Injury: Teeth and Oropharynx as per pre-operative assessment  Comments: Intubation performed by MDA

## 2020-08-05 NOTE — H&P (Signed)
Urology Preoperative H&P   Chief Complaint: left ovarian abscess  History of Present Illness: Crystal Wiley is a 44 y.o. female with a chronic left-sided tubo-ovarian abscess.  Dr. Algie Coffer is performing a robotic hysterectomy with left salpingo-oopherectomy and has requested intraoperative stent placement to aid in left ureteral identification.  The patient has no prior urologic history and states that she is urinating without difficulty.  Past Medical History:  Diagnosis Date  . Anxiety   . Gestational diabetes    Diet controlled  . Hypothyroidism    Hashimoto's  . SVD (spontaneous vaginal delivery) 11/18/2013  . SVD (spontaneous vaginal delivery) (12/27) 11/18/2013    Past Surgical History:  Procedure Laterality Date  . ABLATION  08/2019   ovarian  . WISDOM TOOTH EXTRACTION      Allergies: No Known Allergies  Family History  Problem Relation Age of Onset  . Cancer Paternal Aunt        breast  . Diabetes Paternal Grandmother   . Cancer Paternal Grandmother        pancreatic cancer    Social History:  reports that she has never smoked. She has never used smokeless tobacco. She reports that she does not drink alcohol and does not use drugs.  ROS: A complete review of systems was performed.  All systems are negative except for pertinent findings as noted.  Physical Exam:  Vital signs in last 24 hours: Temp:  [99 F (37.2 C)] 99 F (37.2 C) (09/13 1145) Pulse Rate:  [74] 74 (09/13 1145) Resp:  [18] 18 (09/13 1145) BP: (160)/(96) 160/96 (09/13 1145) SpO2:  [100 %] 100 % (09/13 1145) Weight:  [80.8 kg] 80.8 kg (09/13 1145) Constitutional:  Alert and oriented, No acute distress Cardiovascular: Regular rate and rhythm, No JVD Respiratory: Normal respiratory effort, Lungs clear bilaterally GI: Abdomen is soft, nontender, nondistended, no abdominal masses GU: No CVA tenderness Lymphatic: No lymphadenopathy Neurologic: Grossly intact, no focal deficits Psychiatric:  Normal mood and affect  Laboratory Data:  No results for input(s): WBC, HGB, HCT, PLT in the last 72 hours.  No results for input(s): NA, K, CL, GLUCOSE, BUN, CALCIUM, CREATININE in the last 72 hours.  Invalid input(s): CO3   Results for orders placed or performed during the hospital encounter of 08/05/20 (from the past 24 hour(s))  Pregnancy, urine POC     Status: None   Collection Time: 08/05/20 11:07 AM  Result Value Ref Range   Preg Test, Ur NEGATIVE NEGATIVE   Recent Results (from the past 240 hour(s))  SARS CORONAVIRUS 2 (TAT 6-24 HRS) Nasopharyngeal Nasopharyngeal Swab     Status: None   Collection Time: 08/01/20  8:05 AM   Specimen: Nasopharyngeal Swab  Result Value Ref Range Status   SARS Coronavirus 2 NEGATIVE NEGATIVE Final    Comment: (NOTE) SARS-CoV-2 target nucleic acids are NOT DETECTED.  The SARS-CoV-2 RNA is generally detectable in upper and lower respiratory specimens during the acute phase of infection. Negative results do not preclude SARS-CoV-2 infection, do not rule out co-infections with other pathogens, and should not be used as the sole basis for treatment or other patient management decisions. Negative results must be combined with clinical observations, patient history, and epidemiological information. The expected result is Negative.  Fact Sheet for Patients: HairSlick.no  Fact Sheet for Healthcare Providers: quierodirigir.com  This test is not yet approved or cleared by the [place] FDA and  has been authorized for detection and/or diagnosis of SARS-CoV-2 by FDA under  an Emergency Use Authorization (EUA). This EUA will remain  in effect (meaning this test can be used) for the duration of the COVID-19 declaration under Se ction 564(b)(1) of the Act, 21 U.S.C. section 360bbb-3(b)(1), unless the authorization is terminated or revoked sooner.  Performed at [facility], [address], [place], [place] [address]     Renal Function: No results for input(s): CREATININE in the last 168 hours. Estimated Creatinine Clearance: 92.3 mL/min (by C-G formula based on SCr of 0.5 mg/dL).  Radiologic Imaging: No results found.  I independently reviewed the above imaging studies.  Assessment and Plan Crystal Wiley is a 44 y.o. female with chronic left-sided tubo-ovarian abscess  -The risks, benefits and alternatives of cystoscopy with LEFT JJ stent placement and possible left ureterolysis and/or ureteral reconstruction was discussed with the patient.  Risks include, but are not limited to: bleeding, urinary tract infection, ureteral injury, ureteral stricture disease, chronic pain, urinary symptoms, bladder injury, stent migration, the need for nephrostomy tube placement, MI, CVA, DVT, PE and the inherent risks with general anesthesia.  The patient voices understanding and wishes to proceed.    Rhoderick Moody, MD 08/05/2020, 12:51 PM  [facility] Pager: [phone]

## 2020-08-05 NOTE — Anesthesia Postprocedure Evaluation (Signed)
Anesthesia Post Note  Patient: Crystal Wiley  Procedure(s) Performed: XI ROBOTIC ASSISTED TOTAL HYSTERECTOMY WITH Left SALPINGO OOPHORECTOMY/Right Salpingectomy (N/A ) RIGID SIGMOIDOSCOPY (N/A Rectum) ENTEROLYSIS OF ADHESION (Abdomen) CYSTOSCOPY WITH STENT PLACEMENT, RETROGRADE PYELOGRAM (N/A Urethra)     Patient location during evaluation: PACU Anesthesia Type: General Level of consciousness: awake and alert Pain management: pain level controlled Vital Signs Assessment: post-procedure vital signs reviewed and stable Respiratory status: spontaneous breathing, nonlabored ventilation, respiratory function stable and patient connected to nasal cannula oxygen Cardiovascular status: blood pressure returned to baseline and stable Postop Assessment: no apparent nausea or vomiting Anesthetic complications: no   No complications documented.  Last Vitals:  Vitals:   08/05/20 2029 08/05/20 2059  BP: (!) 140/96 (!) 157/99  Pulse: 100 96  Resp: 16 12  Temp: (!) 36.4 C 36.7 C  SpO2: 97% 98%    Last Pain:  Vitals:   08/05/20 2029  TempSrc:   PainSc: 4                  Eila Runyan S

## 2020-08-05 NOTE — Progress Notes (Signed)
Called in to see if she could take pain meds before coming in for surgery. Checked preop instructions to see shat meds she was instructed to take and med list to see what meds she has been prescribed. Spoke with DR. Stoltzfus and he advised to only take Tylenol. Pt advised.

## 2020-08-05 NOTE — Anesthesia Preprocedure Evaluation (Addendum)
Anesthesia Evaluation  Patient identified by MRN, date of birth, ID band  Reviewed: Patient's Chart, lab work & pertinent test results  Airway Mallampati: II  TM Distance: >3 FB Neck ROM: Full    Dental no notable dental hx. (+) Teeth Intact   Pulmonary neg pulmonary ROS,    Pulmonary exam normal        Cardiovascular negative cardio ROS   Rhythm:Regular Rate:Normal     Neuro/Psych Anxiety negative neurological ROS     GI/Hepatic negative GI ROS, Neg liver ROS,   Endo/Other  diabetesHypothyroidism Pre-diabetic, not on medications  Renal/GU negative Renal ROS  Female GU complaint menorrhagia     Musculoskeletal negative musculoskeletal ROS (+)   Abdominal Normal abdominal exam  (+)  Abdomen: soft. Bowel sounds: normal.  Peds negative pediatric ROS (+)  Hematology  (+) anemia , Fe deficiency   Anesthesia Other Findings   Reproductive/Obstetrics negative OB ROS                             Anesthesia Physical Anesthesia Plan  ASA: II  Anesthesia Plan: General   Post-op Pain Management:    Induction: Intravenous  PONV Risk Score and Plan: 4 or greater and Ondansetron and Dexamethasone  Airway Management Planned: Mask and Oral ETT  Additional Equipment: None  Intra-op Plan:   Post-operative Plan: Extubation in OR  Informed Consent: I have reviewed the patients History and Physical, chart, labs and discussed the procedure including the risks, benefits and alternatives for the proposed anesthesia with the patient or authorized representative who has indicated his/her understanding and acceptance.       Plan Discussed with: CRNA and Surgeon  Anesthesia Plan Comments: (Lab Results      Component                Value               Date                      WBC                      7.2                 07/25/2020                HGB                      12.3                 07/25/2020                HCT                      37.2                07/25/2020                MCV                      86.5                07/25/2020                PLT                      286  07/25/2020           Covid-19 Nucleic Acid Test Results Lab Results      Component                Value               Date                      SARSCOV2NAA              NEGATIVE            08/01/2020                SARSCOV2                 Negative            01/21/2020           Lab Results      Component                Value               Date                      PREGTESTUR               NEGATIVE            08/05/2020                HCG                      5.6 (H)             01/22/2020          )      Anesthesia Quick Evaluation

## 2020-08-06 DIAGNOSIS — N7013 Chronic salpingitis and oophoritis: Secondary | ICD-10-CM | POA: Diagnosis present

## 2020-08-06 DIAGNOSIS — F419 Anxiety disorder, unspecified: Secondary | ICD-10-CM | POA: Diagnosis present

## 2020-08-06 DIAGNOSIS — Z79899 Other long term (current) drug therapy: Secondary | ICD-10-CM | POA: Diagnosis not present

## 2020-08-06 DIAGNOSIS — E876 Hypokalemia: Secondary | ICD-10-CM | POA: Diagnosis not present

## 2020-08-06 DIAGNOSIS — G8918 Other acute postprocedural pain: Secondary | ICD-10-CM | POA: Diagnosis not present

## 2020-08-06 DIAGNOSIS — Z79891 Long term (current) use of opiate analgesic: Secondary | ICD-10-CM | POA: Diagnosis not present

## 2020-08-06 DIAGNOSIS — N920 Excessive and frequent menstruation with regular cycle: Secondary | ICD-10-CM | POA: Diagnosis present

## 2020-08-06 DIAGNOSIS — E039 Hypothyroidism, unspecified: Secondary | ICD-10-CM | POA: Diagnosis present

## 2020-08-06 DIAGNOSIS — Z79890 Hormone replacement therapy (postmenopausal): Secondary | ICD-10-CM | POA: Diagnosis not present

## 2020-08-06 DIAGNOSIS — N736 Female pelvic peritoneal adhesions (postinfective): Secondary | ICD-10-CM | POA: Diagnosis present

## 2020-08-06 DIAGNOSIS — Z20822 Contact with and (suspected) exposure to covid-19: Secondary | ICD-10-CM | POA: Diagnosis present

## 2020-08-06 DIAGNOSIS — R000 Tachycardia, unspecified: Secondary | ICD-10-CM | POA: Diagnosis not present

## 2020-08-06 LAB — CBC
HCT: 35 % — ABNORMAL LOW (ref 36.0–46.0)
Hemoglobin: 11.5 g/dL — ABNORMAL LOW (ref 12.0–15.0)
MCH: 28.9 pg (ref 26.0–34.0)
MCHC: 32.9 g/dL (ref 30.0–36.0)
MCV: 87.9 fL (ref 80.0–100.0)
Platelets: 353 10*3/uL (ref 150–400)
RBC: 3.98 MIL/uL (ref 3.87–5.11)
RDW: 13.7 % (ref 11.5–15.5)
WBC: 13 10*3/uL — ABNORMAL HIGH (ref 4.0–10.5)
nRBC: 0 % (ref 0.0–0.2)

## 2020-08-06 LAB — CBC WITH DIFFERENTIAL/PLATELET
Abs Immature Granulocytes: 0.06 10*3/uL (ref 0.00–0.07)
Basophils Absolute: 0 10*3/uL (ref 0.0–0.1)
Basophils Relative: 0 %
Eosinophils Absolute: 0 10*3/uL (ref 0.0–0.5)
Eosinophils Relative: 0 %
HCT: 33.2 % — ABNORMAL LOW (ref 36.0–46.0)
Hemoglobin: 10.7 g/dL — ABNORMAL LOW (ref 12.0–15.0)
Immature Granulocytes: 1 %
Lymphocytes Relative: 14 %
Lymphs Abs: 1.6 10*3/uL (ref 0.7–4.0)
MCH: 28.2 pg (ref 26.0–34.0)
MCHC: 32.2 g/dL (ref 30.0–36.0)
MCV: 87.4 fL (ref 80.0–100.0)
Monocytes Absolute: 0.7 10*3/uL (ref 0.1–1.0)
Monocytes Relative: 6 %
Neutro Abs: 8.7 10*3/uL — ABNORMAL HIGH (ref 1.7–7.7)
Neutrophils Relative %: 79 %
Platelets: 288 10*3/uL (ref 150–400)
RBC: 3.8 MIL/uL — ABNORMAL LOW (ref 3.87–5.11)
RDW: 13.8 % (ref 11.5–15.5)
WBC: 11 10*3/uL — ABNORMAL HIGH (ref 4.0–10.5)
nRBC: 0 % (ref 0.0–0.2)

## 2020-08-06 LAB — COMPREHENSIVE METABOLIC PANEL
ALT: 11 U/L (ref 0–44)
AST: 16 U/L (ref 15–41)
Albumin: 3.5 g/dL (ref 3.5–5.0)
Alkaline Phosphatase: 63 U/L (ref 38–126)
Anion gap: 14 (ref 5–15)
BUN: 7 mg/dL (ref 6–20)
CO2: 27 mmol/L (ref 22–32)
Calcium: 8.7 mg/dL — ABNORMAL LOW (ref 8.9–10.3)
Chloride: 107 mmol/L (ref 98–111)
Creatinine, Ser: 0.74 mg/dL (ref 0.44–1.00)
GFR calc Af Amer: >60 mL/min (ref >60)
GFR calc non Af Amer: >60 mL/min (ref >60)
Glucose, Bld: 137 mg/dL — ABNORMAL HIGH (ref 70–99)
Potassium: 3.7 mmol/L (ref 3.5–5.1)
Sodium: 148 mmol/L — ABNORMAL HIGH (ref 135–145)
Total Bilirubin: 0.3 mg/dL (ref 0.3–1.2)
Total Protein: 6.7 g/dL (ref 6.5–8.1)

## 2020-08-06 MED ORDER — ZOLPIDEM TARTRATE 5 MG PO TABS
5.0000 mg | ORAL_TABLET | Freq: Every evening | ORAL | Status: DC | PRN
  Administered 2020-08-07 – 2020-08-08 (×2): 5 mg via ORAL
  Filled 2020-08-06 (×2): qty 1

## 2020-08-06 MED ORDER — SIMETHICONE 80 MG PO CHEW
CHEWABLE_TABLET | ORAL | Status: AC
  Filled 2020-08-06: qty 1

## 2020-08-06 MED ORDER — CHLORHEXIDINE GLUCONATE CLOTH 2 % EX PADS: 6.0000 | MEDICATED_PAD | Freq: Every day | CUTANEOUS | Status: DC

## 2020-08-06 MED ORDER — HYDROMORPHONE HCL 1 MG/ML IJ SOLN
INTRAMUSCULAR | Status: AC
  Filled 2020-08-06: qty 1

## 2020-08-06 MED ORDER — OXYCODONE HCL 5 MG PO TABS
15.0000 mg | ORAL_TABLET | ORAL | Status: DC | PRN
  Administered 2020-08-06 – 2020-08-08 (×4): 15 mg via ORAL
  Filled 2020-08-06 (×4): qty 3

## 2020-08-06 MED ORDER — CEFAZOLIN SODIUM-DEXTROSE 2-4 GM/100ML-% IV SOLN
INTRAVENOUS | Status: AC
  Filled 2020-08-06: qty 100

## 2020-08-06 MED ORDER — MORPHINE SULFATE (PF) 4 MG/ML IV SOLN
4.0000 mg | INTRAVENOUS | Status: DC | PRN
  Administered 2020-08-06 – 2020-08-07 (×2): 4 mg via INTRAVENOUS
  Filled 2020-08-06 (×2): qty 1

## 2020-08-06 MED ORDER — IBUPROFEN 800 MG PO TABS
800.0000 mg | ORAL_TABLET | Freq: Three times a day (TID) | ORAL | Status: DC
  Administered 2020-08-07 – 2020-08-08 (×5): 800 mg via ORAL
  Filled 2020-08-06 (×5): qty 1

## 2020-08-06 NOTE — Progress Notes (Signed)
Called about pain control  7p- nurse reports minimal pain control with Dilaudid 0.5mg  IV q 1hr. Pt also getting oxyIR 10mg  q 4 hrs.   Pulse in 100, mildly elevated bp, tol clears and small solids but no appetite. pt had been ambulating. Nurse reports + flatus and abdomen soft. Decision to stop IV Dilaudid and increase oxyIR to 15mg  q 4hrs  1040p nurse reports continued pain, HR now in 110s, still afebrile and pt crying out in pain. Pt requesting more pain meds.   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 Will remove ureteral stent now in case that is source of pain- no evidence ureteral injury intra-op. Will add back IV pain meds, morphine as Dilaudid was not helping much. Per nurse bowel function returning and that is reassuring. Pt with significant pelvic dissection and LOA so pain expected. Still need to watch closely as while no bowel or ureteral injury noted intraop, risks still present for unidentified injury based on extent of dissection. Labs ordered for am.   08/06/2020 10:47 PM

## 2020-08-06 NOTE — Progress Notes (Signed)
1 Day Post-Op    CC:  Tuboovarian abscess  Subjective: She is sore but no major complaints, her port sites look fine, no flatus so far.    Objective: Vital signs in last 24 hours: Temp:  [97.3 F (36.3 C)-99 F (37.2 C)] 97.5 F (36.4 C) (09/14 0545) Pulse Rate:  [74-115] 115 (09/14 0545) Resp:  [10-22] 20 (09/14 0545) BP: (125-160)/(82-104) 125/82 (09/14 0545) SpO2:  [93 %-100 %] 93 % (09/14 0545) Weight:  [80.8 kg] 80.8 kg (09/13 1145)   480 PO 2000 IV Urine 4050 Afebrile, tachycardic Na 148, glucose 137 WBC 13.0 H/H 11.5/35 Intake/Output from previous day: 09/13 0701 - 09/14 0700 In: 2480 [P.O.:480; I.V.:2000] Out: 4150 [Urine:4050; Blood:100] Intake/Output this shift: No intake/output data recorded.  General appearance: alert, cooperative and no distress GI: soft, sore, sites look fine.  no significant distension  Lab Results:  Recent Labs    08/06/20 0717  WBC 13.0*  HGB 11.5*  HCT 35.0*  PLT 353    BMET No results for input(s): NA, K, CL, CO2, GLUCOSE, BUN, CREATININE, CALCIUM in the last 72 hours. PT/INR No results for input(s): LABPROT, INR in the last 72 hours.  No results for input(s): AST, ALT, ALKPHOS, BILITOT, PROT, ALBUMIN in the last 168 hours.   Lipase  No results found for: LIPASE   Prior to Admission medications   Medication Sig Start Date End Date Taking? Authorizing Provider  acetaminophen (TYLENOL) 500 MG tablet Take 500 mg by mouth every 6 (six) hours as needed for mild pain or moderate pain.   Yes [provider]  busPIRone (BUSPAR) 15 MG tablet Take 15 mg by mouth 2 (two) times daily.  05/22/20  Yes [provider]  levothyroxine (SYNTHROID) 112 MCG tablet TAKE 1 TABLET BY MOUTH  DAILY BEFORE BREAKFAST Patient taking differently: Take 112 mcg by mouth daily before breakfast. TAKE 1 TABLET BY MOUTH  DAILY BEFORE BREAKFAST 01/08/20  Yes Nida, Denman George, MD  oxyCODONE-acetaminophen (PERCOCET/ROXICET) 5-325 MG  tablet Take 1 tablet by mouth at bedtime. 07/01/20  Yes [provider]  traMADol (ULTRAM) 50 MG tablet Take 50 mg by mouth 2 (two) times daily as needed for pain. 07/16/20  Yes [provider]  WELLBUTRIN SR 200 MG 12 hr tablet Take 200 mg by mouth 2 (two) times daily.  12/07/18  Yes [provider]  Cholecalciferol 1.25 MG (50000 UT) TABS Take 1 capsule by mouth daily with breakfast. Patient not taking: Reported on 07/22/2020    [provider]  ferrous sulfate 325 (65 FE) MG EC tablet Take 325 mg by mouth 3 (three) times daily with meals. Patient not taking: Reported on 07/22/2020    [provider]  HYDROcodone-acetaminophen (NORCO/VICODIN) 5-325 MG tablet Take 1 tablet by mouth every 4 (four) hours as needed. Patient not taking: Reported on 07/22/2020 01/22/20   Henderly, Britni A, PA-C  magnesium 30 MG tablet Take 30 mg by mouth 2 (two) times daily. Patient not taking: Reported on 07/22/2020    [provider]  naproxen (NAPROSYN) 500 MG tablet Take 1 tablet (500 mg total) by mouth 2 (two) times daily with a meal. Prn pain Patient not taking: Reported on 07/22/2020 06/20/20   Campbell Riches, NP  ondansetron (ZOFRAN ODT) 4 MG disintegrating tablet Take 1 tablet (4 mg total) by mouth every 8 (eight) hours as needed for nausea or vomiting. Patient not taking: Reported on 07/22/2020 01/22/20   Henderly, Britni A, PA-C  Medications: . pantoprazole  40 mg Oral Daily   .  ceFAZolin (ANCEF) IV 2 g (08/06/20 0440)    Assessment/Plan Hypothyroid Anxiety Hx gestational diabetes   Menorrhagia, Pelvic Pain, Pelvic Mass, chronic tubo-ovarian abscess S/p XI ROBOTIC ASSISTED TOTAL HYSTERECTOMY WITH Left SALPINGO OOPHORECTOMY/Right Salpingectomy (N/A) CYSTOSCOPY WITH STENT PLACEMENT, RETROGRADE PYELOGRAM (N/A) RIGID SIGMOIDOSCOPY (N/A)  Enterolysis,  Left ureterolyis, 08/05/20, Dr. Noland Fordyce Intraoperative consult with rigid Sigmoidoscopy; No  injuries appreciated - 08/06/20 Dr Chevis Pretty III POD#1  FEN:  Clears liquids ID:  Ancef 9/13 >> day 2 DVT:  SCD's Follow up:  Dr. Ernestina Penna   Plan:  I( saw her with Dr. Ernestina Penna.  She looks fine this AM, soreness you would expect.  The current plan is to admit, and observe over the next 24 hours.  Advance her diet, and see how she does, repeat labs in AM.  We will follow with you.    LOS: 0 days    Bailley Guilford 08/06/2020 Please see Amion

## 2020-08-06 NOTE — Progress Notes (Signed)
Pt placed in wc to rm 1608 with nurse. Report called to [employer]. Pt stable.

## 2020-08-06 NOTE — Progress Notes (Signed)
Postop day #1 status post robotic assisted total laparoscopic hysterectomy with right salpingectomy, left salpingo-oophorectomy with enterolysis, ureterolysis and removal of left abscess cavity.  Subjective: Patient notes no nausea or emesis but limited appetite. Did tolerate some small clears last night. Patient notes left back pain, abdominal pain but controlled with Dilaudid every hour. Patient notes no flatus. Foley still in. Patient notes upper abdominal discomfort. Patient did walk several times last night. Denies chest pain or shortness of breath.  Objective: Vitals:   08/06/20 0138 08/06/20 0545  BP: (!) 147/104 125/82  Pulse: (!) 108 (!) 115  Resp: (!) 22 20  Temp: 98 F (36.7 C) (!) 97.5 F (36.4 C)  SpO2: 98% 93%    General: Well-appearing, no distress Abdomen: Soft distention, tender throughout Incisions: Well approximated, Dermabond in place Lower extremity: Nontender, no edema, SCDs currently off  CBC    Component Value Date/Time   WBC 13.0 (H) 08/06/2020 0717   RBC 3.98 08/06/2020 0717   HGB 11.5 (L) 08/06/2020 0717   HCT 35.0 (L) 08/06/2020 0717   PLT 353 08/06/2020 0717   MCV 87.9 08/06/2020 0717   MCH 28.9 08/06/2020 0717   MCHC 32.9 08/06/2020 0717   RDW 13.7 08/06/2020 0717   LYMPHSABS 2.7 06/20/2020 1549   MONOABS 0.4 06/20/2020 1549   EOSABS 0.2 06/20/2020 1549   BASOSABS 0.0 06/20/2020 1549    Assessment plan: 44 year old postop day 1 status post robotic assisted hysterectomy with LSO for chronic tubo-ovarian abscess.  -Postoperatively. Recovering appropriately. Advance diet today. Moved to p.o. pain medicine and drop IV fluids if able. Will leave left ureteral stent and Foley cath in through today given risks of ureteral edema. If patient doing well will remove tonight. General surgery following. Intraoperative consult to evaluate for possible rectal injury though no evidence during intraoperative evaluation. Given spill of fluid and abscess cavity plan  24 hours of Ancef. If more concern for bowel injury will need to broaden antibiotic coverage. SCDs and incentive spirometer reviewed with patient.   Lendon Colonel 08/06/2020 8:26 AM

## 2020-08-06 NOTE — Progress Notes (Addendum)
MD called about uncontrolled pain. Patient is moaning and sobbing 10/10 abdominal pain. Patient MEWS yellow d/t HR. New orders placed. PRN pain med given. Will recheck vitals in 1 hours and initate yellow mews protocol.

## 2020-08-07 ENCOUNTER — Inpatient Hospital Stay (HOSPITAL_COMMUNITY): Payer: Managed Care, Other (non HMO)

## 2020-08-07 ENCOUNTER — Encounter (HOSPITAL_BASED_OUTPATIENT_CLINIC_OR_DEPARTMENT_OTHER): Payer: Self-pay | Admitting: Obstetrics

## 2020-08-07 LAB — CBC WITH DIFFERENTIAL/PLATELET
Abs Immature Granulocytes: 0.04 10*3/uL (ref 0.00–0.07)
Basophils Absolute: 0 10*3/uL (ref 0.0–0.1)
Basophils Relative: 0 %
Eosinophils Absolute: 0.1 10*3/uL (ref 0.0–0.5)
Eosinophils Relative: 1 %
HCT: 29.3 % — ABNORMAL LOW (ref 36.0–46.0)
Hemoglobin: 9.4 g/dL — ABNORMAL LOW (ref 12.0–15.0)
Immature Granulocytes: 0 %
Lymphocytes Relative: 24 %
Lymphs Abs: 2.4 10*3/uL (ref 0.7–4.0)
MCH: 28.2 pg (ref 26.0–34.0)
MCHC: 32.1 g/dL (ref 30.0–36.0)
MCV: 88 fL (ref 80.0–100.0)
Monocytes Absolute: 0.6 10*3/uL (ref 0.1–1.0)
Monocytes Relative: 6 %
Neutro Abs: 6.9 10*3/uL (ref 1.7–7.7)
Neutrophils Relative %: 69 %
Platelets: 250 10*3/uL (ref 150–400)
RBC: 3.33 MIL/uL — ABNORMAL LOW (ref 3.87–5.11)
RDW: 13.8 % (ref 11.5–15.5)
WBC: 10.2 10*3/uL (ref 4.0–10.5)
nRBC: 0 % (ref 0.0–0.2)

## 2020-08-07 LAB — COMPREHENSIVE METABOLIC PANEL
ALT: 10 U/L (ref 0–44)
AST: 15 U/L (ref 15–41)
Albumin: 3.1 g/dL — ABNORMAL LOW (ref 3.5–5.0)
Alkaline Phosphatase: 58 U/L (ref 38–126)
Anion gap: 9 (ref 5–15)
BUN: <5 mg/dL — ABNORMAL LOW (ref 6–20)
CO2: 24 mmol/L (ref 22–32)
Calcium: 8.1 mg/dL — ABNORMAL LOW (ref 8.9–10.3)
Chloride: 111 mmol/L (ref 98–111)
Creatinine, Ser: 0.51 mg/dL (ref 0.44–1.00)
GFR calc Af Amer: >60 mL/min (ref >60)
GFR calc non Af Amer: >60 mL/min (ref >60)
Glucose, Bld: 118 mg/dL — ABNORMAL HIGH (ref 70–99)
Potassium: 3 mmol/L — ABNORMAL LOW (ref 3.5–5.1)
Sodium: 144 mmol/L (ref 135–145)
Total Bilirubin: 0.6 mg/dL (ref 0.3–1.2)
Total Protein: 6.2 g/dL — ABNORMAL LOW (ref 6.5–8.1)

## 2020-08-07 LAB — SURGICAL PATHOLOGY

## 2020-08-07 MED ORDER — METHOCARBAMOL 500 MG PO TABS
1000.0000 mg | ORAL_TABLET | Freq: Three times a day (TID) | ORAL | Status: DC
  Administered 2020-08-07 – 2020-08-08 (×4): 1000 mg via ORAL
  Filled 2020-08-07 (×4): qty 2

## 2020-08-07 MED ORDER — POLYETHYLENE GLYCOL 3350 17 G PO PACK
17.0000 g | PACK | Freq: Every day | ORAL | Status: DC
  Administered 2020-08-07 – 2020-08-08 (×2): 17 g via ORAL
  Filled 2020-08-07 (×2): qty 1

## 2020-08-07 MED ORDER — IOHEXOL 300 MG/ML  SOLN
100.0000 mL | Freq: Once | INTRAMUSCULAR | Status: AC | PRN
  Administered 2020-08-07: 100 mL via INTRAVENOUS

## 2020-08-07 MED ORDER — ACETAMINOPHEN 500 MG PO TABS
1000.0000 mg | ORAL_TABLET | Freq: Four times a day (QID) | ORAL | Status: DC
  Administered 2020-08-07 – 2020-08-08 (×5): 1000 mg via ORAL
  Filled 2020-08-07 (×5): qty 2

## 2020-08-07 MED ORDER — POTASSIUM CHLORIDE 10 MEQ/100ML IV SOLN
10.0000 meq | INTRAVENOUS | Status: DC
  Administered 2020-08-07: 10 meq via INTRAVENOUS
  Filled 2020-08-07: qty 100

## 2020-08-07 MED ORDER — DOCUSATE SODIUM 100 MG PO CAPS
100.0000 mg | ORAL_CAPSULE | Freq: Two times a day (BID) | ORAL | Status: DC
  Administered 2020-08-07 – 2020-08-08 (×3): 100 mg via ORAL
  Filled 2020-08-07 (×3): qty 1

## 2020-08-07 MED ORDER — POTASSIUM CHLORIDE CRYS ER 20 MEQ PO TBCR
20.0000 meq | EXTENDED_RELEASE_TABLET | Freq: Two times a day (BID) | ORAL | Status: AC
  Administered 2020-08-07 (×2): 20 meq via ORAL
  Filled 2020-08-07 (×2): qty 1

## 2020-08-07 MED ORDER — LIDOCAINE 5 % EX PTCH
1.0000 | MEDICATED_PATCH | CUTANEOUS | Status: DC
  Administered 2020-08-08: 1 via TRANSDERMAL
  Filled 2020-08-07 (×2): qty 1

## 2020-08-07 MED ORDER — ACETAMINOPHEN 500 MG PO TABS: 1000.0000 mg | ORAL_TABLET | Freq: Four times a day (QID) | ORAL | Status: DC | PRN

## 2020-08-07 NOTE — Plan of Care (Signed)
  Problem: Clinical Measurements: Goal: Diagnostic test results will improve 08/07/2020 1604 by Lovenia Shuck, RN Outcome: Progressing 08/07/2020 1604 by Lovenia Shuck, RN Outcome: Progressing   Problem: Clinical Measurements: Goal: Respiratory complications will improve 08/07/2020 1604 by Lovenia Shuck, RN Outcome: Progressing 08/07/2020 1604 by Lovenia Shuck, RN Outcome: Progressing

## 2020-08-07 NOTE — Progress Notes (Signed)
Pt is not tolerating IV potassium, c/o burning. IV is stopped and MD is notified. Received new verbal orders. Orders carried out.

## 2020-08-07 NOTE — Progress Notes (Signed)
2 Days Post-Op    CC:  Tuboovarian abscess  Subjective: Reports a lot of pain overnight but feels slightly better this morning. States she thinks she is having a lot of gas pains - pain described as intermittent cramping upper abdominal pain and abdominal soreness that is worse with movement. Reports she is urinating and having flatus but has not had a BM.  Mild nausea this AM that improved with medication, denies emesis. She is getting up to the bathroom independently and has been walking in the hallway to try to help with the gas pain.    Objective: Vital signs in last 24 hours: Temp:  [97.8 F (36.6 C)-99 F (37.2 C)] 99 F (37.2 C) (09/15 0609) Pulse Rate:  [108-133] 110 (09/15 0609) Resp:  [15-18] 15 (09/15 0609) BP: (129-170)/(93-107) 148/98 (09/15 0609) SpO2:  [93 %-99 %] 93 % (09/15 0609) Last BM Date: 08/05/20 480 PO 2000 IV Urine 4050 Afebrile, tachycardic Na 148, glucose 137 WBC 13.0 H/H 11.5/35 Intake/Output from previous day: 09/14 0701 - 09/15 0700 In: 1600 [P.O.:1200; IV Piggyback:400] Out: 6550 [Urine:6550] Intake/Output this shift: No intake/output data recorded.  General appearance: alert, cooperative and no distress, walking from bathroom to her bed GI: soft, sore, TTP epigastrium without rebound tenderness or guarding, trochar sites are c/d/i without drainage or cellulitis, +BS all 4 quadrants, non-tender lower abdomen.  Lab Results:  Recent Labs    08/06/20 1915 08/07/20 0531  WBC 11.0* 10.2  HGB 10.7* 9.4*  HCT 33.2* 29.3*  PLT 288 250    BMET Recent Labs    08/06/20 0717 08/07/20 0531  NA 148* 144  K 3.7 3.0*  CL 107 111  CO2 27 24  GLUCOSE 137* 118*  BUN 7 <5*  CREATININE 0.74 0.51  CALCIUM 8.7* 8.1*   PT/INR No results for input(s): LABPROT, INR in the last 72 hours.  Recent Labs  Lab 08/06/20 0717 08/07/20 0531  AST 16 15  ALT 11 10  ALKPHOS 63 58  BILITOT 0.3 0.6  PROT 6.7 6.2*  ALBUMIN 3.5 3.1*     Lipase  No  results found for: LIPASE   Prior to Admission medications   Medication Sig Start Date End Date Taking? Authorizing Provider  acetaminophen (TYLENOL) 500 MG tablet Take 500 mg by mouth every 6 (six) hours as needed for mild pain or moderate pain.   Yes [provider]  busPIRone (BUSPAR) 15 MG tablet Take 15 mg by mouth 2 (two) times daily.  05/22/20  Yes [provider]  levothyroxine (SYNTHROID) 112 MCG tablet TAKE 1 TABLET BY MOUTH  DAILY BEFORE BREAKFAST Patient taking differently: Take 112 mcg by mouth daily before breakfast. TAKE 1 TABLET BY MOUTH  DAILY BEFORE BREAKFAST 01/08/20  Yes Nida, Denman George, MD  oxyCODONE-acetaminophen (PERCOCET/ROXICET) 5-325 MG tablet Take 1 tablet by mouth at bedtime. 07/01/20  Yes [provider]  traMADol (ULTRAM) 50 MG tablet Take 50 mg by mouth 2 (two) times daily as needed for pain. 07/16/20  Yes [provider]  WELLBUTRIN SR 200 MG 12 hr tablet Take 200 mg by mouth 2 (two) times daily.  12/07/18  Yes [provider]  Cholecalciferol 1.25 MG (50000 UT) TABS Take 1 capsule by mouth daily with breakfast. Patient not taking: Reported on 07/22/2020    [provider]  ferrous sulfate 325 (65 FE) MG EC tablet Take 325 mg by mouth 3 (three) times daily with meals. Patient not taking: Reported on 07/22/2020  [provider]  HYDROcodone-acetaminophen (NORCO/VICODIN) 5-325 MG tablet Take 1 tablet by mouth every 4 (four) hours as needed. Patient not taking: Reported on 07/22/2020 01/22/20   Henderly, Britni A, PA-C  magnesium 30 MG tablet Take 30 mg by mouth 2 (two) times daily. Patient not taking: Reported on 07/22/2020    [provider]  naproxen (NAPROSYN) 500 MG tablet Take 1 tablet (500 mg total) by mouth 2 (two) times daily with a meal. Prn pain Patient not taking: Reported on 07/22/2020 06/20/20   Campbell Riches, NP  ondansetron (ZOFRAN ODT) 4 MG disintegrating tablet Take 1 tablet (4  mg total) by mouth every 8 (eight) hours as needed for nausea or vomiting. Patient not taking: Reported on 07/22/2020 01/22/20   Henderly, Britni A, PA-C    Medications: . acetaminophen  1,000 mg Oral Q6H  . Chlorhexidine Gluconate Cloth  6 each Topical Daily  . docusate sodium  100 mg Oral BID  . ibuprofen  800 mg Oral Q8H  . lidocaine  1 patch Transdermal Q24H  . methocarbamol  1,000 mg Oral TID  . pantoprazole  40 mg Oral Daily  . polyethylene glycol  17 g Oral Daily   . potassium chloride      Assessment/Plan Hypothyroid Anxiety Hx gestational diabetes   Menorrhagia, Pelvic Pain, Pelvic Mass, chronic tubo-ovarian abscess S/p XI ROBOTIC ASSISTED TOTAL HYSTERECTOMY WITH Left SALPINGO OOPHORECTOMY/Right Salpingectomy (N/A) CYSTOSCOPY WITH STENT PLACEMENT, RETROGRADE PYELOGRAM (N/A) RIGID SIGMOIDOSCOPY (N/A)  Enterolysis,  Left ureterolyis, 08/05/20, Dr. Noland Fordyce Intraoperative consult with rigid Sigmoidoscopy; No injuries appreciated - 08/06/20 Dr Chevis Pretty III POD#2  FEN:  Reg ID:  Ancef 9/13 >> day 2 DVT:  SCD's Follow up:  Dr. Ernestina Penna   Plan: based on my examination I believe the patient has uncontrolled post-operative pain and have a low suspicion for rectal leak at this point. I have scheduled her non-narcotic pain medications, added robaxin and a lidoderm patch to try to help "stay ahead" of her pain. She has been on chronic narcotics for at least 4 weeks. She does not have an acute abdomen on exam and she is have flatus and mobilizing. I will re-examine her abdomen this afternoon. Low threshold for CT with rectal contrast if her exam worsens, she spikes a fever, or has worsening tachycardia at rest.    LOS: 1 day    Adam Phenix 08/07/2020 Please see Amion

## 2020-08-07 NOTE — Progress Notes (Signed)
Postop day #2 status post robotic assisted total laparoscopic hysterectomy with right salpingectomy and left salpingo-oophorectomy with enterolysis, left ureterolysis, left ureteral stent placement and removal of chronic left tubo-ovarian abscess.  Subjective: Patient notes significant pain control issues yesterday and overnight.  Patient notes long delay from asking for pain medications until receiving pain medications.  patient had improvement in pain after left ureteral stent was removed late last night.  Patient notes oral narcotic medications only hold pain for about 1 hour.  She is just started on standing ibuprofen last night.  Patient notes up and walking.  Voiding since Foley was removed.  Patient notes nausea starting this a.m. only.  Tolerated clears and small solids yesterday.  No emesis.  Patient notes flatus and feels like she could possibly have a bowel movement today.  Patient states no vaginal bleeding.  No chest pain, no shortness of breath.  Patient notes slept well last night with Ambien.  Patient denies heartburn.  Objective:  Vitals with BMI 08/07/2020 08/07/2020 08/06/2020  Height - - -  Weight - - -  BMI - - -  Systolic 148 144 035  Diastolic 98 100 99  Pulse 110 465 123   General: Appears well, no distress Abdomen: Appropriately tender, soft this a.m. without distention.  No tympany.  No rebound or guarding Incisions: Clean, closed, Dermabond GU: No staining Lower extremity: SCDs in place this a.m.  Nontender  CBC Latest Ref Rng & Units 08/07/2020 08/06/2020 08/06/2020  WBC 4.0 - 10.5 K/uL 10.2 11.0(H) 13.0(H)  Hemoglobin 12.0 - 15.0 g/dL 6.8(L) 10.7(L) 11.5(L)  Hematocrit 36 - 46 % 29.3(L) 33.2(L) 35.0(L)  Platelets 150 - 400 K/uL 250 288 353   CMP     Component Value Date/Time   NA 144 08/07/2020 0531   K 3.0 (L) 08/07/2020 0531   CL 111 08/07/2020 0531   CO2 24 08/07/2020 0531   GLUCOSE 118 (H) 08/07/2020 0531   BUN <5 (L) 08/07/2020 0531   BUN 5 12/20/2018  0000   CREATININE 0.51 08/07/2020 0531   CALCIUM 8.1 (L) 08/07/2020 0531   PROT 6.2 (L) 08/07/2020 0531   ALBUMIN 3.1 (L) 08/07/2020 0531   AST 15 08/07/2020 0531   ALT 10 08/07/2020 0531   ALKPHOS 58 08/07/2020 0531   BILITOT 0.6 08/07/2020 0531   GFRNONAA >60 08/07/2020 0531   GFRAA >60 08/07/2020 0531   Assessment plan: 44 year old postop day 2 status post robotic assisted hysterectomy with left pelvic sidewall dissection and LSO for chronic tubo-ovarian abscess. -Pain control seems to be patient's biggest concern at this time.  Will move to standing ibuprofen and acetaminophen.  Patient on oral OxyIR and as needed IV morphine.  Patient is using these to the max doses.  Discussed with patient she needs to be able to decrease her IV pain medication use in order to be a candidate for discharge to home.  Once patient is tolerating p.o. and can increase her interval of IV narcotic we will try and transition to long-acting OxyContin. -Hypokalemia.  Likely the cause of a.m. nausea.  Will replete today.  Encourage patient to advance diet. -Bowel function slowly returning.  Continue to ambulate -Anemia.  Discussed with patient she will need iron on discharge but will start once patient is having regular bowel movements.  Some drop in her hemoglobin since yesterday.  Will repeat tomorrow. -White blood cell count improving.  Lendon Colonel 08/07/2020 8:29 AM

## 2020-08-08 LAB — CBC WITH DIFFERENTIAL/PLATELET
Abs Immature Granulocytes: 0.03 10*3/uL (ref 0.00–0.07)
Basophils Absolute: 0 10*3/uL (ref 0.0–0.1)
Basophils Relative: 0 %
Eosinophils Absolute: 0.3 10*3/uL (ref 0.0–0.5)
Eosinophils Relative: 4 %
HCT: 27.3 % — ABNORMAL LOW (ref 36.0–46.0)
Hemoglobin: 8.8 g/dL — ABNORMAL LOW (ref 12.0–15.0)
Immature Granulocytes: 0 %
Lymphocytes Relative: 34 %
Lymphs Abs: 2.4 10*3/uL (ref 0.7–4.0)
MCH: 28.1 pg (ref 26.0–34.0)
MCHC: 32.2 g/dL (ref 30.0–36.0)
MCV: 87.2 fL (ref 80.0–100.0)
Monocytes Absolute: 0.4 10*3/uL (ref 0.1–1.0)
Monocytes Relative: 6 %
Neutro Abs: 3.8 10*3/uL (ref 1.7–7.7)
Neutrophils Relative %: 56 %
Platelets: 241 10*3/uL (ref 150–400)
RBC: 3.13 MIL/uL — ABNORMAL LOW (ref 3.87–5.11)
RDW: 13.4 % (ref 11.5–15.5)
WBC: 6.9 10*3/uL (ref 4.0–10.5)
nRBC: 0 % (ref 0.0–0.2)

## 2020-08-08 MED ORDER — OXYCODONE HCL 5 MG PO TABS
15.0000 mg | ORAL_TABLET | ORAL | 0 refills | Status: DC | PRN
Start: 2020-08-08 — End: 2021-01-07

## 2020-08-08 MED ORDER — IBUPROFEN 800 MG PO TABS
800.0000 mg | ORAL_TABLET | Freq: Three times a day (TID) | ORAL | 1 refills | Status: DC
Start: 2020-08-08 — End: 2022-02-13

## 2020-08-08 MED ORDER — METHOCARBAMOL 500 MG PO TABS
1000.0000 mg | ORAL_TABLET | Freq: Three times a day (TID) | ORAL | 0 refills | Status: DC
Start: 2020-08-08 — End: 2021-01-07

## 2020-08-08 MED ORDER — DOCUSATE SODIUM 100 MG PO CAPS: 100.0000 mg | ORAL_CAPSULE | Freq: Two times a day (BID) | ORAL | 1 refills | Status: DC

## 2020-08-08 MED ORDER — ZOLPIDEM TARTRATE 5 MG PO TABS: 5.0000 mg | ORAL_TABLET | Freq: Every evening | ORAL | 0 refills | Status: DC | PRN

## 2020-08-08 MED ORDER — PANTOPRAZOLE SODIUM 40 MG PO TBEC: 40.0000 mg | DELAYED_RELEASE_TABLET | Freq: Every day | ORAL | 0 refills | Status: DC

## 2020-08-08 MED ORDER — ACETAMINOPHEN 500 MG PO TABS
1000.0000 mg | ORAL_TABLET | Freq: Four times a day (QID) | ORAL | 1 refills | Status: DC
Start: 2020-08-08 — End: 2021-01-07

## 2020-08-08 MED ORDER — POLYETHYLENE GLYCOL 3350 17 G PO PACK: 17.0000 g | PACK | Freq: Every day | ORAL | 0 refills | Status: DC

## 2020-08-08 NOTE — Progress Notes (Signed)
3 Days Post-Op    XB:[id number], Pelvic Pain, Pelvic Mass, chronic tubo-ovarian abscess   Subjective: Pt sitting up in bed, still feels pretty worn out.  She says she has not eaten much, her husband is bringing her a biscuit.  She has seen Dr. Ernestina Penna and is going home later today.    Objective: Vital signs in last 24 hours: Temp:  [97.8 F (36.6 C)-98.7 F (37.1 C)] 98.4 F (36.9 C) (09/16 0435) Pulse Rate:  [97-99] 98 (09/16 0435) Resp:  [14-16] 14 (09/16 0435) BP: (123-143)/(79-96) 123/79 (09/16 0435) SpO2:  [94 %-100 %] 94 % (09/16 0435) Last BM Date: 08/05/20 CT 9/15:  1. No evidence of extraluminal contrast leak to suggest rectal Perforation. 2. Moderate pneumoperitoneum throughout the anterior peritoneal cavity. Scattered extraperitoneal gas throughout the pelvis and presacral space. Scattered subcutaneous gas throughout the ventral abdominal wall, left greater than right. Findings are compatible with recent postoperative status. No focal fluid collections. 3. Nonspecific mild urothelial wall thickening throughout the left ureter, cannot exclude ascending left urinary tract infection. No overt hydronephrosis. 4. Mild-to-moderate dependent bibasilar atelectasis. 720 Po recorded Urine x 3 recorded No BM recorded Afebrile, VSS, BP is better WBC 6.9 H/H 11.5/35 >>8.8/27.3(9/16)  Intake/Output from previous day: 09/15 0701 - 09/16 0700 In: 720 [P.O.:720] Out: -  Intake/Output this shift: No intake/output data recorded.  General appearance: alert, cooperative, no distress and still doesn't feel very good GI: soft, port sites look good, + BS, lidocain patch in place.  Lab Results:  Recent Labs    08/07/20 0531 08/08/20 0546  WBC 10.2 6.9  HGB 9.4* 8.8*  HCT 29.3* 27.3*  PLT 250 241    BMET Recent Labs    08/06/20 0717 08/07/20 0531  NA 148* 144  K 3.7 3.0*  CL 107 111  CO2 27 24  GLUCOSE 137* 118*  BUN 7 <5*  CREATININE 0.74 0.51  CALCIUM 8.7* 8.1*    PT/INR No results for input(s): LABPROT, INR in the last 72 hours.  Recent Labs  Lab 08/06/20 0717 08/07/20 0531  AST 16 15  ALT 11 10  ALKPHOS 63 58  BILITOT 0.3 0.6  PROT 6.7 6.2*  ALBUMIN 3.5 3.1*     Lipase  No results found for: LIPASE   Medications: . acetaminophen  1,000 mg Oral Q6H  . Chlorhexidine Gluconate Cloth  6 each Topical Daily  . docusate sodium  100 mg Oral BID  . ibuprofen  800 mg Oral Q8H  . lidocaine  1 patch Transdermal Q24H  . methocarbamol  1,000 mg Oral TID  . pantoprazole  40 mg Oral Daily  . polyethylene glycol  17 g Oral Daily    Assessment/Plan Hypothyroid Anxiety Hx gestational diabetes   Menorrhagia, Pelvic Pain, Pelvic Mass, chronic tubo-ovarian abscess S/p XI ROBOTIC ASSISTED TOTAL HYSTERECTOMY WITH Left SALPINGO OOPHORECTOMY/Right Salpingectomy (N/A) CYSTOSCOPY WITH STENT PLACEMENT, RETROGRADE PYELOGRAM (N/A) RIGID SIGMOIDOSCOPY (N/A) Enterolysis, Left ureterolyis, 08/05/20, Dr. Noland Fordyce Intraoperative consult with rigid Sigmoidoscopy; No injuries appreciated - 08/06/20 Dr Chevis Pretty III POD#3 CT pelvis with contrast 9/15:No evidence of extraluminal contrast leak to suggest rectal perforation.  FEN:  Reg ID:  Ancef 9/13- 08/06/20 DVT:  SCD's Follow up:  Dr. Ernestina Penna    Plan:  No evidence of rectal injury.  She can follow up with Dr. Ernestina Penna.  Please call if we can be of further assistance.  LOS: 2 days    Eudell Mcphee 08/08/2020 Please see Amion

## 2020-08-08 NOTE — Discharge Summary (Signed)
Crystal Wiley MRN: [id number] DOB/AGE: 1976/01/16 44 y.o.  Admit date: 08/05/2020 Discharge date: August 08, 2020  Admission Diagnoses: Menorrhagia [N92.0] S/P hysterectomy with oophorectomy [Z90.710, Z90.721]  Chronic tubo-ovarian abscess, pelvic pain  Discharge Diagnoses: Menorrhagia [N92.0] S/P hysterectomy with oophorectomy [Z90.710, Z90.721]        Active Problems:   Menorrhagia   S/P hysterectomy with oophorectomy   Discharged Condition: stable  Hospital Course: Patient was admitted for planned surgical procedure.  Given the complexity of the mass in the preoperative concern for significant adhesions with ureteral involvement urology began the case.  A left ureteral stent was placed.  A robotic assisted total laparoscopic hysterectomy with left salpingo-oophorectomy and right salpingectomy with left ureterolysis and enterolysis was completed.  Surgery was complicated by significant adhesions and prolonged length of surgery.  Given concern for possible rectal injury intraoperative surgical consult was obtained.  No evidence for bowel injury was noted.  Surgical time was approximately 6 hours.  Patient then had overnight recovery in the day surgery area.  Patient had continued pain and needed to increase her Dilaudid to every 1 hour.  On postop day 1 pain control remains an issue and while patient was having small amounts of flatus she had limited appetite.  Slight increase in her white count was noted.  Patient was transferred to [facility] long main for longer surgical recovery.  Patient did add oral narcotics as well as Tylenol and ibuprofen on postop day 1 but continue to remain dependent on IV narcotics.  Her pain did controlled better on the evening of postop day 1 after removal of the left ureteral stent..  On postop day 2 her bowel function was improving she was able to tolerate small solids and continued for flatus.  She had improved pain control and was able to limit her IV narcotic  use.  White count was back down to a normal range.  Throughout her stay patient did have slight tachycardia and slight elevated blood pressures.  Suspicion was due to pain control and anxiety.  Surgical consultants then ordered rectal contrast CT which showed no evidence of bowel leak.  On postop day 3 patient reports improved pain control tolerating regular p.o.  Passing flatus.  Patient still dependent on oral narcotics.  Minimal vaginal bleeding.  Patient denies chest pain or shortness of breath.  Patient denies dizziness  Physical exam: Vitals with BMI 08/08/2020 08/07/2020 08/07/2020  Height - - -  Weight - - -  BMI - - -  Systolic 123 130 202  Diastolic 79 81 96  Pulse 98 99 97    General: Well-appearing, no distress Abdomen: Diffusely tender though much improved from prior.  No distention, no rigidity, no tympany Incisions: Well-healing GU: No staining Lower extremity: Nontender, no edema, SCDs in place   Consults: general surgery and urology  Treatments: surgery: Robotic assisted total laparoscopic hysterectomy with left salpingo-oophorectomy, right salpingectomy, enterolysis, left ureterolysis, left ureteral stent placement.  Disposition: Discharge disposition: 01-Home or Self Care        Allergies as of 08/08/2020   No Known Allergies     Medication List    STOP taking these medications   Cholecalciferol 1.25 MG (50000 UT) Tabs   HYDROcodone-acetaminophen 5-325 MG tablet Commonly known as: NORCO/VICODIN   naproxen 500 MG tablet Commonly known as: Naprosyn   ondansetron 4 MG disintegrating tablet Commonly known as: Zofran ODT   oxyCODONE-acetaminophen 5-325 MG tablet Commonly known as: PERCOCET/ROXICET   traMADol 50 MG tablet Commonly  known as: ULTRAM     TAKE these medications   acetaminophen 500 MG tablet Commonly known as: TYLENOL Take 2 tablets (1,000 mg total) by mouth every 6 (six) hours. What changed:   how much to take  when to take  this  reasons to take this   busPIRone 15 MG tablet Commonly known as: BUSPAR Take 15 mg by mouth 2 (two) times daily.   docusate sodium 100 MG capsule Commonly known as: COLACE Take 1 capsule (100 mg total) by mouth 2 (two) times daily.   ferrous sulfate 325 (65 FE) MG EC tablet Take 325 mg by mouth 3 (three) times daily with meals.   ibuprofen 800 MG tablet Commonly known as: ADVIL Take 1 tablet (800 mg total) by mouth every 8 (eight) hours.   levothyroxine 112 MCG tablet Commonly known as: SYNTHROID TAKE 1 TABLET BY MOUTH  DAILY BEFORE BREAKFAST What changed:   how much to take  how to take this  when to take this   magnesium 30 MG tablet Take 30 mg by mouth 2 (two) times daily.   methocarbamol 500 MG tablet Commonly known as: ROBAXIN Take 2 tablets (1,000 mg total) by mouth 3 (three) times daily.   oxyCODONE 5 MG immediate release tablet Commonly known as: Oxy IR/ROXICODONE Take 3 tablets (15 mg total) by mouth every 4 (four) hours as needed for severe pain or breakthrough pain.   pantoprazole 40 MG tablet Commonly known as: PROTONIX Take 1 tablet (40 mg total) by mouth daily.   polyethylene glycol 17 g packet Commonly known as: MIRALAX / GLYCOLAX Take 17 g by mouth daily.   Wellbutrin SR 200 MG 12 hr tablet Generic drug: buPROPion Take 200 mg by mouth 2 (two) times daily.   zolpidem 5 MG tablet Commonly known as: AMBIEN Take 1 tablet (5 mg total) by mouth at bedtime as needed for sleep.        Signed: Lendon Colonel, MD 08/08/2020, 3:47 PM

## 2020-09-13 LAB — HEPATIC FUNCTION PANEL
ALT: 13 (ref 7–35)
AST: 11 — AB (ref 13–35)
Alkaline Phosphatase: 133 — AB (ref 25–125)
Bilirubin, Total: 0.2

## 2020-09-13 LAB — BASIC METABOLIC PANEL
BUN: 3 — AB (ref 4–21)
CO2: 22 (ref 13–22)
Chloride: 104 (ref 99–108)
Creatinine: 0.6 (ref 0.5–1.1)
Glucose: 100
Potassium: 3.8 (ref 3.4–5.3)
Sodium: 139 (ref 137–147)

## 2020-09-13 LAB — COMPREHENSIVE METABOLIC PANEL
Albumin: 4.2 (ref 3.5–5.0)
Calcium: 8.8 (ref 8.7–10.7)
GFR calc Af Amer: 128
GFR calc non Af Amer: 111
Globulin: 2.3

## 2020-09-13 LAB — TSH: TSH: 0.42 (ref 0.41–5.90)

## 2020-12-17 LAB — COMPREHENSIVE METABOLIC PANEL
Calcium: 9.1 (ref 8.7–10.7)
GFR calc Af Amer: 126
GFR calc non Af Amer: 109

## 2020-12-17 LAB — BASIC METABOLIC PANEL
BUN: 5 (ref 4–21)
Creatinine: 0.7 (ref 0.5–1.1)

## 2020-12-17 LAB — LIPID PANEL
Cholesterol: 211 — AB (ref 0–200)
HDL: 72 — AB (ref 35–70)
LDL Cholesterol: 121
Triglycerides: 104 (ref 40–160)

## 2020-12-17 LAB — TSH: TSH: 1.55 (ref 0.41–5.90)

## 2020-12-17 LAB — VITAMIN D 25 HYDROXY (VIT D DEFICIENCY, FRACTURES): Vit D, 25-Hydroxy: 26.7

## 2020-12-17 LAB — HEMOGLOBIN A1C: Hemoglobin A1C: 5.5

## 2021-01-07 ENCOUNTER — Other Ambulatory Visit: Payer: Self-pay

## 2021-01-07 ENCOUNTER — Encounter: Payer: Self-pay | Admitting: "Endocrinology

## 2021-01-07 ENCOUNTER — Ambulatory Visit: Payer: Managed Care, Other (non HMO) | Admitting: "Endocrinology

## 2021-01-07 VITALS — BP 128/86 | HR 72 | Ht 63.0 in | Wt 194.4 lb

## 2021-01-07 DIAGNOSIS — R7303 Prediabetes: Secondary | ICD-10-CM | POA: Diagnosis not present

## 2021-01-07 DIAGNOSIS — E039 Hypothyroidism, unspecified: Secondary | ICD-10-CM | POA: Diagnosis not present

## 2021-01-07 MED ORDER — LEVOTHYROXINE SODIUM 112 MCG PO TABS
112.0000 ug | ORAL_TABLET | Freq: Every day | ORAL | 3 refills | Status: DC
Start: 2021-01-07 — End: 2022-05-19

## 2021-01-07 NOTE — Progress Notes (Signed)
01/07/2021  Endocrinology follow-up note  Subjective:    Patient ID: Crystal Wiley, female    DOB: [birth year],    Past Medical History:  Diagnosis Date  . Anxiety   . Gestational diabetes    Diet controlled  . Hypothyroidism    Hashimoto's  . SVD (spontaneous vaginal delivery) 11/18/2013  . SVD (spontaneous vaginal delivery) (12/27) 11/18/2013   Past Surgical History:  Procedure Laterality Date  . ABLATION  08/2019   ovarian  . CYSTOSCOPY WITH STENT PLACEMENT N/A 08/05/2020   Procedure: CYSTOSCOPY WITH STENT PLACEMENT, RETROGRADE PYELOGRAM;  Surgeon: Rene Paci, MD;  Location: [facility];  Service: Urology;  Laterality: N/A;  . FLEXIBLE SIGMOIDOSCOPY N/A 08/05/2020   Procedure: RIGID SIGMOIDOSCOPY;  Surgeon: Noland Fordyce, MD;  Location: [facility];  Service: Gynecology;  Laterality: N/A;  . LYSIS OF ADHESION  08/05/2020   Procedure: ENTEROLYSIS OF ADHESION;  Surgeon: Noland Fordyce, MD;  Location: [facility] [place];  Service: Gynecology;;  . ROBOTIC ASSISTED TOTAL HYSTERECTOMY WITH BILATERAL SALPINGO OOPHERECTOMY N/A 08/05/2020   Procedure: XI ROBOTIC ASSISTED TOTAL HYSTERECTOMY WITH Left SALPINGO OOPHORECTOMY/Right Salpingectomy;  Surgeon: Noland Fordyce, MD;  Location: [facility] [place];  Service: Gynecology;  Laterality: N/A;  . WISDOM TOOTH EXTRACTION     Social History   Socioeconomic History  . Marital status: Married    Spouse name: Not on file  . Number of children: Not on file  . Years of education: Not on file  . Highest education level: Not on file  Occupational History  . Not on file  Tobacco Use  . Smoking status: Never Smoker  . Smokeless tobacco: Never Used  Vaping Use  . Vaping Use: Never used  Substance and Sexual Activity  . Alcohol use: No  . Drug use: No  . Sexual activity: Yes  Other Topics Concern  . Not on file  Social History Narrative  . Not on file   Social Determinants  of Health   Financial Resource Strain: Not on file  Food Insecurity: Not on file  Transportation Needs: Not on file  Physical Activity: Not on file  Stress: Not on file  Social Connections: Not on file   Outpatient Encounter Medications as of 01/07/2021  Medication Sig  . Cholecalciferol (VITAMIN D3) 125 MCG (5000 UT) CAPS Take 1 capsule by mouth daily in the afternoon.  . magnesium 30 MG tablet Take 30 mg by mouth 2 (two) times daily.  . busPIRone (BUSPAR) 15 MG tablet Take 15 mg by mouth 2 (two) times daily.   . ferrous sulfate 325 (65 FE) MG EC tablet Take 325 mg by mouth 3 (three) times daily with meals. (Patient not taking: Reported on 07/22/2020)  . ibuprofen (ADVIL) 800 MG tablet Take 1 tablet (800 mg total) by mouth every 8 (eight) hours.  Marland Kitchen levothyroxine (SYNTHROID) 112 MCG tablet Take 1 tablet (112 mcg total) by mouth daily before breakfast. TAKE 1 TABLET BY MOUTH  DAILY BEFORE BREAKFAST  . WELLBUTRIN SR 200 MG 12 hr tablet Take 200 mg by mouth 2 (two) times daily.   . [DISCONTINUED] acetaminophen (TYLENOL) 500 MG tablet Take 2 tablets (1,000 mg total) by mouth every 6 (six) hours.  . [DISCONTINUED] docusate sodium (COLACE) 100 MG capsule Take 1 capsule (100 mg total) by mouth 2 (two) times daily.  . [DISCONTINUED] levothyroxine (SYNTHROID) 112 MCG tablet TAKE 1 TABLET BY MOUTH  DAILY BEFORE BREAKFAST (Patient taking differently: Take 112 mcg by mouth  daily before breakfast. TAKE 1 TABLET BY MOUTH  DAILY BEFORE BREAKFAST)  . [DISCONTINUED] methocarbamol (ROBAXIN) 500 MG tablet Take 2 tablets (1,000 mg total) by mouth 3 (three) times daily.  . [DISCONTINUED] oxyCODONE (OXY IR/ROXICODONE) 5 MG immediate release tablet Take 3 tablets (15 mg total) by mouth every 4 (four) hours as needed for severe pain or breakthrough pain.  . [DISCONTINUED] pantoprazole (PROTONIX) 40 MG tablet Take 1 tablet (40 mg total) by mouth daily.  . [DISCONTINUED] polyethylene glycol (MIRALAX / GLYCOLAX) 17 g  packet Take 17 g by mouth daily.  . [DISCONTINUED] zolpidem (AMBIEN) 5 MG tablet Take 1 tablet (5 mg total) by mouth at bedtime as needed for sleep.   No facility-administered encounter medications on file as of 01/07/2021.   ALLERGIES: No Known Allergies VACCINATION STATUS: Immunization History  Administered Date(s) Administered  . Influenza Split 08/24/2013  . Influenza,inj,Quad PF,6+ Mos 09/10/2018, 09/01/2020  . Influenza,trivalent, recombinat, inj, PF 08/22/2017  . Tdap 08/24/2013    HPI  Crystal Wiley is a 45 year old female patient with medical history as above.   She is here to follow-up for hypothyroidism, prediabetes with repeat thyroid function tests and A1c.    - She denies new complaints.  In the interim, she underwent partial hysterectomy, one very scared, for endometriosis after several years of pelvic pain complaint.   -  Her previsit thyroid function tests are consistent with appropriate replacement.  Her repeat labs show A1c of 5.5%.  She has a steady weight gain.  She denies palpitations, tremors, nor heat/cold intolerance. she was found to have clinical goiter and recent thyroid ultrasound confirmed mild goiter, repeat ultrasound showing a slight decrease in size and improvement in that echogenicity of the thyroid.  No discrete nodules.  She denies dysphagia, shortness of breath, voice change. she has had hx of GDM .   Objective:    BP 128/86   Pulse 72   Ht 5\' 3"  (1.6 m)   Wt 194 lb 6.4 oz (88.2 kg)   BMI 34.44 kg/m   Wt Readings from Last 3 Encounters:  01/07/21 194 lb 6.4 oz (88.2 kg)  08/05/20 178 lb 3.2 oz (80.8 kg)  07/25/20 185 lb (83.9 kg)    Physical Exam   Physical Exam- Limited  Constitutional:  Body mass index is 34.44 kg/m. , not in acute distress, normal state of mind    Results for orders placed or performed in visit on 01/06/21  VITAMIN D 25 Hydroxy (Vit-D Deficiency, Fractures)  Result Value Ref Range   Vit D, 25-Hydroxy 26.7    Basic metabolic panel  Result Value Ref Range   BUN 5 4 - 21   Creatinine 0.7 0.5 - 1.1  Comprehensive metabolic panel  Result Value Ref Range   GFR calc Af Amer 126    GFR calc non Af Amer 109    Calcium 9.1 8.7 - 10.7  Lipid panel  Result Value Ref Range   Triglycerides 104 40 - 160   Cholesterol 211 (A) 0 - 200   HDL 72 (A) 35 - 70   LDL Cholesterol 121   Hemoglobin A1c  Result Value Ref Range   Hemoglobin A1C 5.5   TSH  Result Value Ref Range   TSH 1.55 0.41 - 5.90     Assessment & Plan:   1. Primary hypothyroidism from Hashimoto's thyroiditis -Her previsit thyroid function tests are consistent with appropriate replacement.  She is advised to continue Synthroid 112 mcg p.o. daily before  breakfast.     - We discussed about the correct intake of her thyroid hormone, on empty stomach at fasting, with water, separated by at least 30 minutes from breakfast and other medications,  and separated by more than 4 hours from calcium, iron, multivitamins, acid reflux medications (PPIs). -Patient is made aware of the fact that thyroid hormone replacement is needed for life, dose to be adjusted by periodic monitoring of thyroid function tests.   2. Prediabetes: - She did not tolerate Metformin during a previous attempt.  Her repeat A1c is stable at 5.5%.  She will be kept on observation only.    Given her recent dyslipidemia,  - she acknowledges that there is a room for improvement in her food and drink choices. - Suggestion is made for her to avoid simple carbohydrates  from her diet including Cakes, Sweet Desserts, Ice Cream, Soda (diet and regular), Sweet Tea, Candies, Chips, Cookies, Store Bought Juices, Alcohol in Excess of  1-2 drinks a day, Artificial Sweeteners,  Coffee Creamer, and "Sugar-free" Products, Lemonade. This will help patient to have more stable blood glucose profile and potentially avoid unintended weight gain.    I advised patient to maintain close follow up  with her PCP for primary care needs.     - Time spent on this patient care encounter:  30 minutes of which 50% was spent in  counseling and the rest reviewing  her current and  previous labs / studies and medications  doses and developing a plan for long term care, and documenting this care. Crystal Wiley  participated in the discussions, expressed understanding, and voiced agreement with the above plans.  All questions were answered to her satisfaction. she is encouraged to contact clinic should she have any questions or concerns prior to her return visit.   Follow up plan: Return in about 1 year (around 01/07/2022) for F/U with Pre-visit Labs, A1c -NV.  Marquis Lunch, MD Phone: [phone]  Fax: [phone]   This note was partially dictated with voice recognition software. Similar sounding words can be transcribed inadequately or may not  be corrected upon review.  01/07/2021, 9:53 AM

## 2021-05-09 IMAGING — CT CT ABD-PELV W/ CM
2 of 5 series · 15 of 46 positions shown, 17 images · IV contrast (APPLIED)
Comparison: 01/22/2020 unenhanced CT abdomen/pelvis.

CLINICAL DATA: Inpatient. Tubo-ovarian abscess status post TAHBSO 2
days prior, with concern for anterior rectal injury. Persistent
postoperative abdominal pain.

EXAM:
CT ABDOMEN AND PELVIS WITH CONTRAST
TECHNIQUE: Multidetector CT imaging of the abdomen and pelvis was performed
using the standard protocol following bolus administration of
intravenous contrast. Water-soluble rectal contrast administered
prior to the scan.
CONTRAST:  100mL OMNIPAQUE IOHEXOL 300 MG/ML  SOLN

[Series 2: axial st · axial · 0.90mm/px · z∈[-749,-314]mm · 12 of 101 slices shown, 14 images]
[im 7/101  soft-tissue]
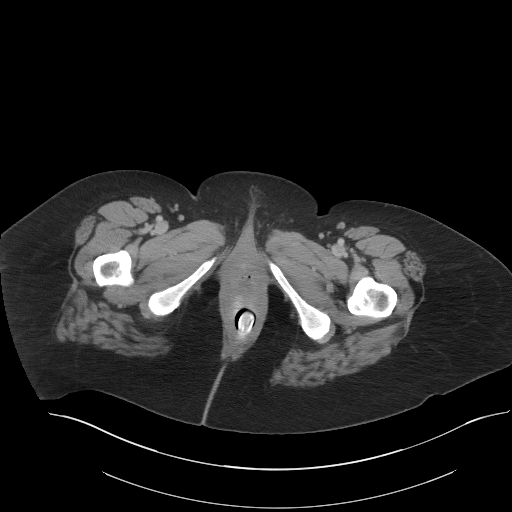
[im 7/101  bone]
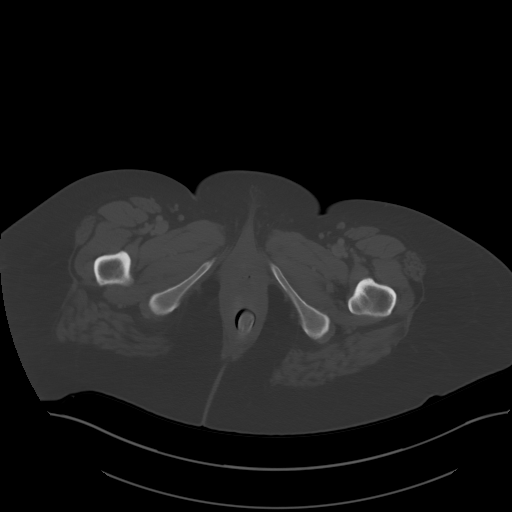
[im 14/101  soft-tissue]
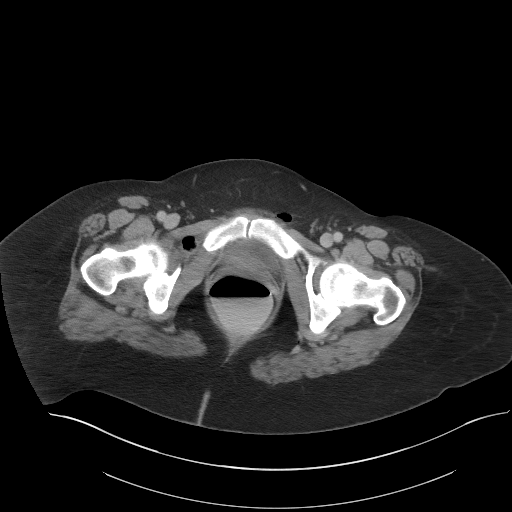
[im 21/101  soft-tissue]
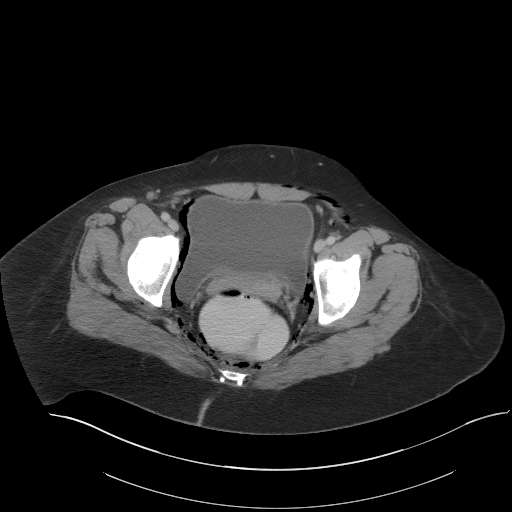
[im 34/101  soft-tissue]
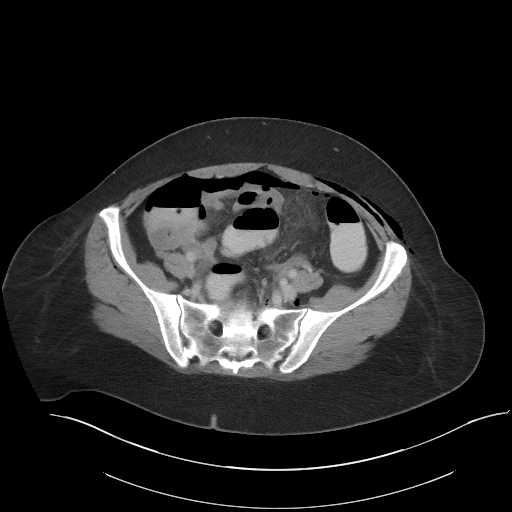
[im 41/101  soft-tissue]
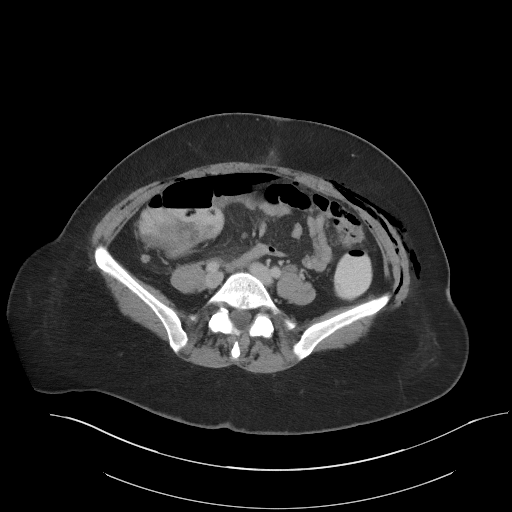
[im 47/101  soft-tissue]
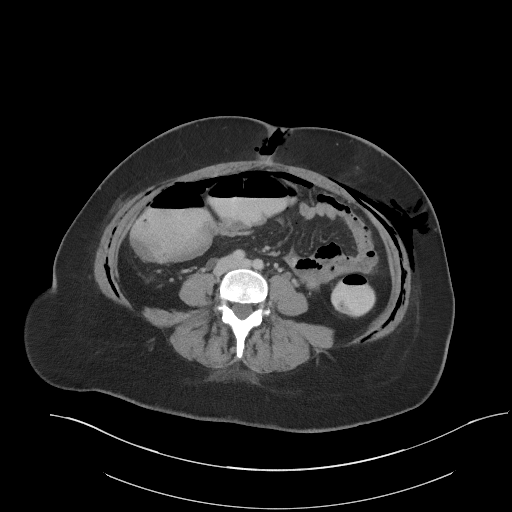
[im 54/101  soft-tissue]
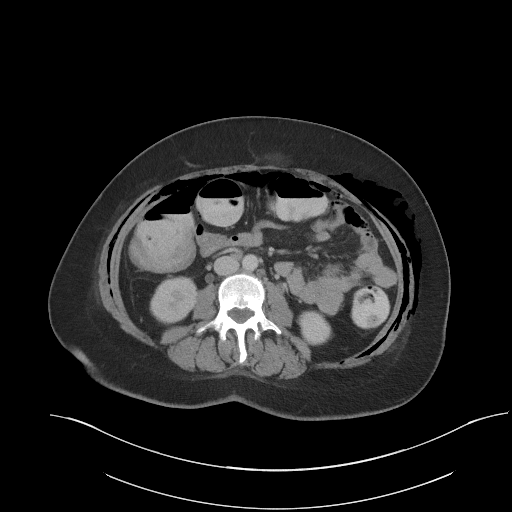
[im 61/101  soft-tissue]
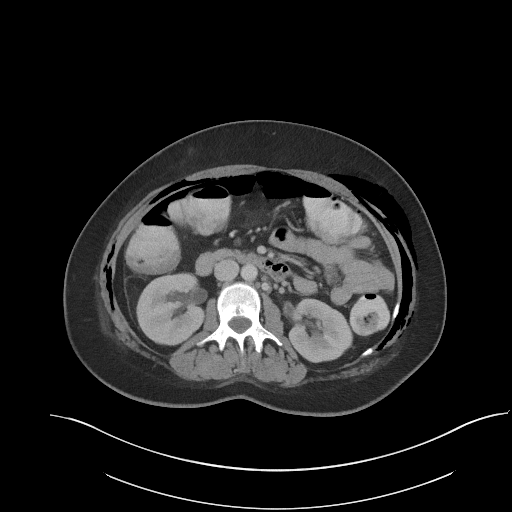
[im 67/101  soft-tissue]
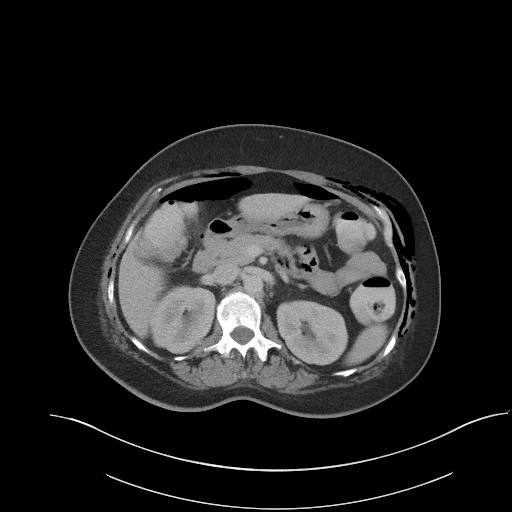
[im 67/101  bone]
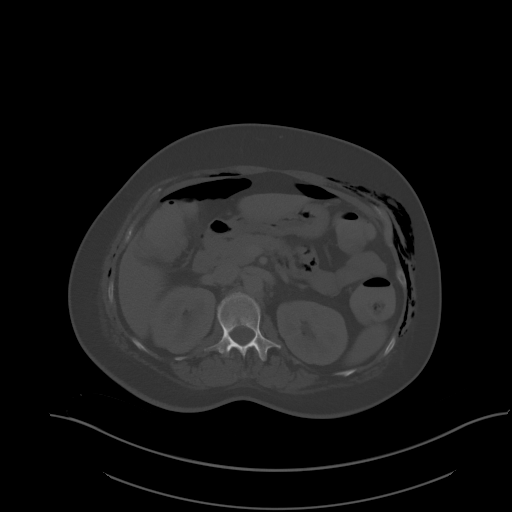
[im 81/101  soft-tissue]
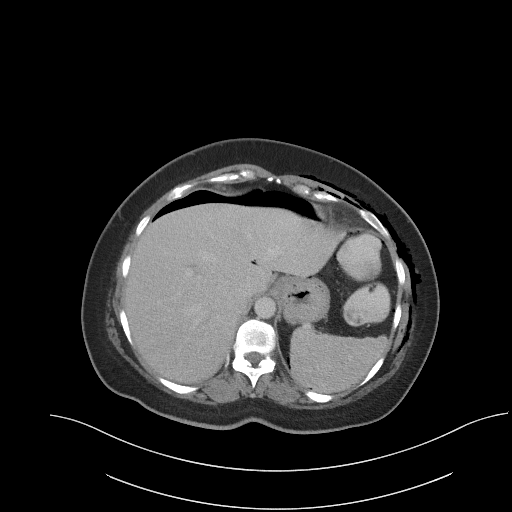
[im 87/101  soft-tissue]
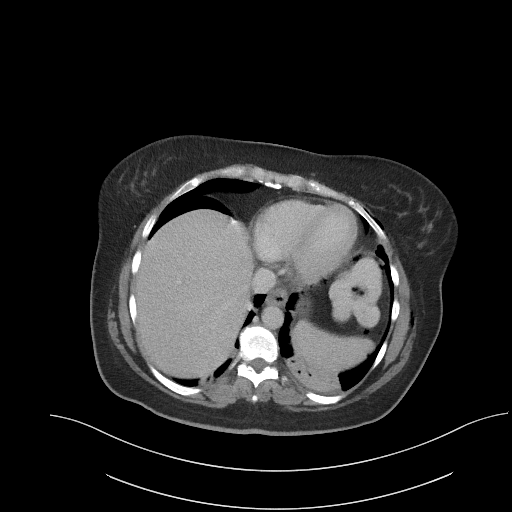
[im 94/101  soft-tissue]
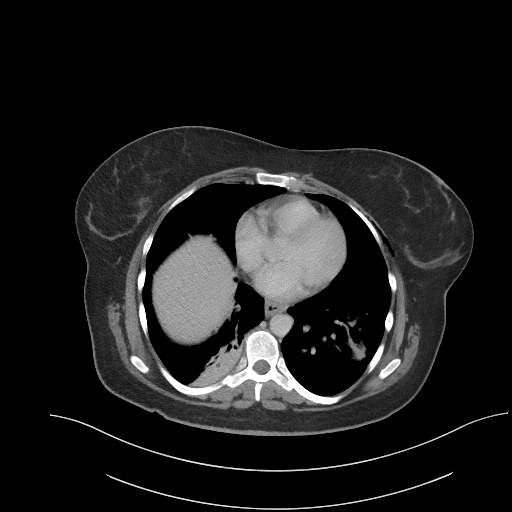

[Series 5: coronal st · coronal · 0.68mm/px · 3 of 95 slices shown]
[im 32/95  soft-tissue]
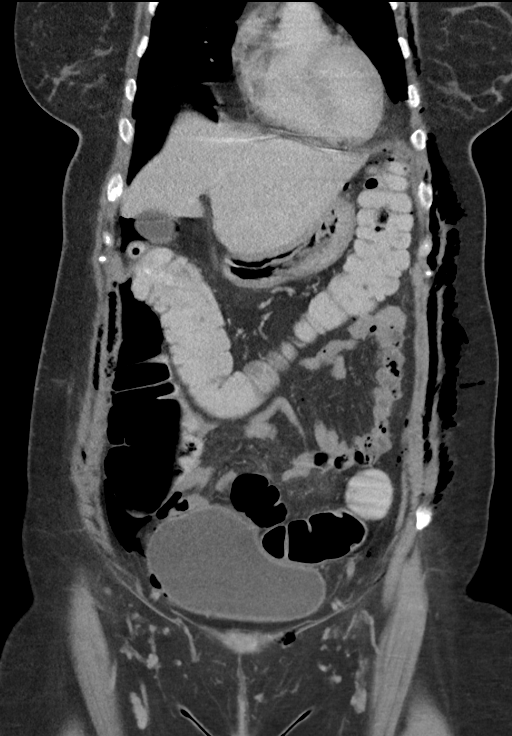
[im 42/95  soft-tissue]
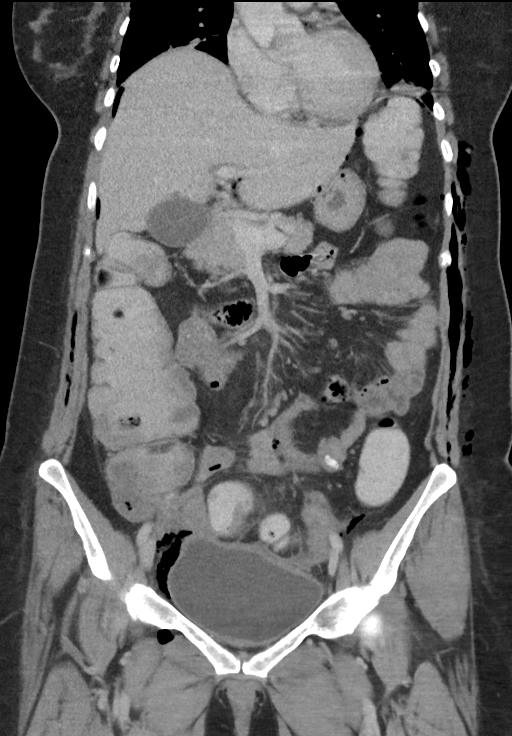
[im 53/95  soft-tissue]
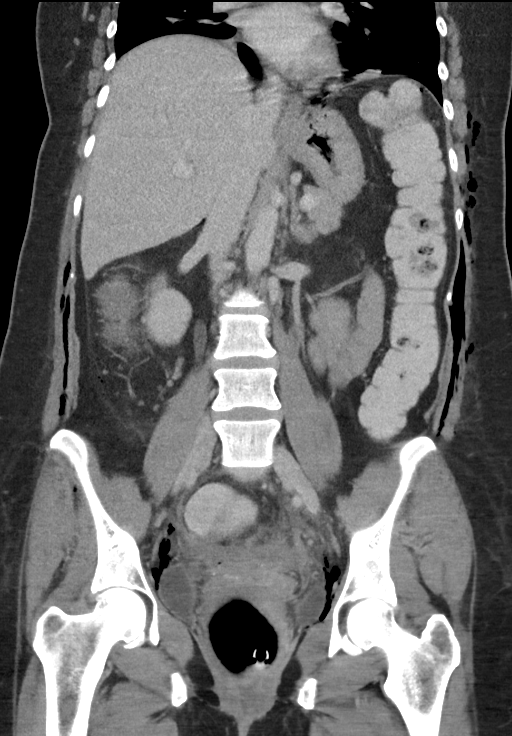

[15 of 46 positions shown; findings below may reference images not displayed]

FINDINGS: Lower chest: Mild-to-moderate dependent bibasilar atelectasis.

Hepatobiliary: Normal liver size. No liver mass. Normal gallbladder
with no radiopaque cholelithiasis. No biliary ductal dilatation.

Pancreas: Normal, with no mass or duct dilation.

Spleen: Normal size. No mass.

Adrenals/Urinary Tract: Normal adrenals. Normal kidneys with no
overt hydronephrosis and no renal mass. Mild urothelial wall
thickening throughout the left ureter. Small amount of gas in the
nondependent bladder lumen. Otherwise normal bladder.

Stomach/Bowel: Normal non-distended stomach. Normal caliber small
bowel with no small bowel wall thickening. Normal appendix. Rectal
contrast transits retrograde to the cecum. No evidence of
extraluminal contrast leak. No large bowel wall thickening,
diverticulosis or pneumatosis. Rectal catheter in place.

Vascular/Lymphatic: Normal caliber abdominal aorta. Patent portal,
splenic, hepatic and renal veins. No pathologically enlarged lymph
nodes in the abdomen or pelvis.

Reproductive: Status post hysterectomy. Small amount of ill-defined
fluid and fat stranding at the hysterectomy margin. No focal fluid
collections. No adnexal masses.

Other: Moderate pneumoperitoneum throughout the anterior peritoneal
cavity. Scattered extraperitoneal gas throughout the pelvis and
presacral space. Scattered subcutaneous gas throughout ventral
abdominal wall, left greater than right. No focal fluid collections.

Musculoskeletal: No aggressive appearing focal osseous lesions.
IMPRESSION: 1. No evidence of extraluminal contrast leak to suggest rectal
perforation.
2. Moderate pneumoperitoneum throughout the anterior peritoneal
cavity. Scattered extraperitoneal gas throughout the pelvis and
presacral space. Scattered subcutaneous gas throughout the ventral
abdominal wall, left greater than right. Findings are compatible
with recent postoperative status. No focal fluid collections.
3. Nonspecific mild urothelial wall thickening throughout the left
ureter, cannot exclude ascending left urinary tract infection. No
overt hydronephrosis.
4. Mild-to-moderate dependent bibasilar atelectasis.

## 2022-01-06 LAB — VITAMIN D 25 HYDROXY (VIT D DEFICIENCY, FRACTURES): Vit D, 25-Hydroxy: 31.4

## 2022-01-06 LAB — BASIC METABOLIC PANEL
BUN: 8 (ref 4–21)
CO2: 24 — AB (ref 13–22)
Chloride: 103 (ref 99–108)
Creatinine: 0.7 (ref 0.5–1.1)
Glucose: 94
Potassium: 4 mEq/L (ref 3.5–5.1)
Sodium: 140 (ref 137–147)

## 2022-01-06 LAB — LIPID PANEL
Cholesterol: 212 — AB (ref 0–200)
HDL: 79 — AB (ref 35–70)
LDL Cholesterol: 123
Triglycerides: 58 (ref 40–160)

## 2022-01-06 LAB — HEPATIC FUNCTION PANEL
ALT: 12 U/L (ref 7–35)
AST: 13 (ref 13–35)
Alkaline Phosphatase: 116 (ref 25–125)

## 2022-01-06 LAB — COMPREHENSIVE METABOLIC PANEL
Albumin: 4.5 (ref 3.5–5.0)
Calcium: 9.1 (ref 8.7–10.7)
Globulin: 2.4

## 2022-01-06 LAB — TSH: TSH: 1.4 (ref 0.41–5.90)

## 2022-01-06 LAB — HEMOGLOBIN A1C: Hemoglobin A1C: 5.4

## 2022-01-07 ENCOUNTER — Ambulatory Visit: Payer: Managed Care, Other (non HMO) | Admitting: "Endocrinology

## 2022-01-09 ENCOUNTER — Ambulatory Visit: Payer: Managed Care, Other (non HMO) | Admitting: Physician Assistant

## 2022-02-02 ENCOUNTER — Ambulatory Visit: Payer: Managed Care, Other (non HMO) | Admitting: "Endocrinology

## 2022-02-13 ENCOUNTER — Ambulatory Visit: Payer: Managed Care, Other (non HMO) | Admitting: Physician Assistant

## 2022-02-13 ENCOUNTER — Encounter: Payer: Self-pay | Admitting: Physician Assistant

## 2022-02-13 VITALS — BP 133/92 | HR 68 | Temp 98.1°F | Ht 63.0 in | Wt 189.0 lb

## 2022-02-13 DIAGNOSIS — F909 Attention-deficit hyperactivity disorder, unspecified type: Secondary | ICD-10-CM

## 2022-02-13 DIAGNOSIS — E669 Obesity, unspecified: Secondary | ICD-10-CM

## 2022-02-13 DIAGNOSIS — F411 Generalized anxiety disorder: Secondary | ICD-10-CM

## 2022-02-13 DIAGNOSIS — E539 Vitamin B deficiency, unspecified: Secondary | ICD-10-CM | POA: Insufficient documentation

## 2022-02-13 DIAGNOSIS — R7303 Prediabetes: Secondary | ICD-10-CM | POA: Diagnosis not present

## 2022-02-13 DIAGNOSIS — E039 Hypothyroidism, unspecified: Secondary | ICD-10-CM

## 2022-02-13 DIAGNOSIS — E7800 Pure hypercholesterolemia, unspecified: Secondary | ICD-10-CM

## 2022-02-13 DIAGNOSIS — N809 Endometriosis, unspecified: Secondary | ICD-10-CM | POA: Insufficient documentation

## 2022-02-13 HISTORY — DX: Pure hypercholesterolemia, unspecified: E78.00

## 2022-02-13 MED ORDER — SEMAGLUTIDE-WEIGHT MANAGEMENT 0.5 MG/0.5ML ~~LOC~~ SOAJ: 0.5000 mg | SUBCUTANEOUS | 2 refills | Status: DC

## 2022-02-13 NOTE — Patient Instructions (Signed)
It was great to see you! ? ?Start Wegovy 0.25 ? ?I have sent in Wegovy 0.5 for you to pick up after done with sample  ? ?Let's follow-up in 3 months, sooner if you have concerns. ? ?Take care, ? ?Inda Coke PA-C  ?

## 2022-02-13 NOTE — Progress Notes (Signed)
Crystal Wiley is a 46 y.o. female here for follow up referred by former PCP, Dr. Gerda DissLuking.  ? ?History of Present Illness:  ? ?Chief Complaint  ?Patient presents with  ? New Patient (Initial Visit)  ? ?Acute Concerns: ?Obese ?Although pt has been regularly exercising and making healthy dietary choices she has found it difficult to shed some weight. In the past she has trialed metformin and phentermine for weight loss but failed both medications. Despite this she is still interested in trialing an additional medication that could help her along in her weight loss journey.  ? ?Chronic Issues: ?Hypothyroidism ?Crystal Wiley is currently compliant with taking synthroid 112 mcg daily with no adverse effects. States that this was dx following the birth of her son in 2015, but believes she has been dealing with thyroid issues her whole life. At this time she is regularly following up with Dr. Fransico HimNida, endocrinology and is managing well. Denies brittle nails, hair loss, weight changes, or heart palpitations.  ? ?Anxiety/ ADD ?Pt is currently compliant with taking lexapro 20 mg daily and Wellbutrin SR 200 mg twice daily, and Ritalin 20 mg twice daily with no complications. She expresses that this regimen has been beneficial for her and has helped keep her mood under control. This is currently managed by Tonna BoehringerAkhila Takkallapalli, PA-psychiatry. She is managing well at this time. Denies SI/HI.  ? ?Hx of Gestational Diabetes/Prediabetes  ?Pt states this occurred during her last pregnancy in 2014 but was not started on any medication for this. Instead she was monitored regularly and focused on healthy eating. Upon further discussion she did take metformin 500 mg twice daily but states this was mainly used for weight loss rather than insulin resistance. Despite this she is managing well.  ? ? ?  02/13/2022  ?  8:54 AM  ?Depression screen PHQ 2/9  ?Decreased Interest 0  ?Down, Depressed, Hopeless 0  ?PHQ - 2 Score 0  ? ? ?   ? View : No data to  display.  ?  ?  ?  ? ? ? ?Other providers/specialists: ?Patient Care Team: ?Jarold MottoWorley, Thaila Bottoms, PA as PCP - General (Physician Assistant)  ? ?Past Medical History:  ?Diagnosis Date  ? Anemia   ? Anxiety   ? Hypothyroidism   ? Hashimoto's  ? Pure hypercholesterolemia 02/13/2022  ? SVD (spontaneous vaginal delivery) (12/27) 11/18/2013  ? ?  ?Social History  ? ?Tobacco Use  ? Smoking status: Never  ? Smokeless tobacco: Never  ?Vaping Use  ? Vaping Use: Never used  ?Substance Use Topics  ? Alcohol use: No  ? Drug use: No  ? ? ?Past Surgical History:  ?Procedure Laterality Date  ? ABDOMINAL HYSTERECTOMY    ? ABLATION  08/2019  ? ovarian  ? CYSTOSCOPY WITH STENT PLACEMENT N/A 08/05/2020  ? Procedure: CYSTOSCOPY WITH STENT PLACEMENT, RETROGRADE PYELOGRAM;  Surgeon: Rene PaciWinter, Christopher Aaron, MD;  [facility] [place];  Service: Urology;  Laterality: N/A;  ? FLEXIBLE SIGMOIDOSCOPY N/A 08/05/2020  ? Procedure: RIGID SIGMOIDOSCOPY;  Surgeon: Noland FordyceFogleman, Kelly, MD;  [facility] [place];  Service: Gynecology;  Laterality: N/A;  ? LYSIS OF ADHESION  08/05/2020  ? Procedure: ENTEROLYSIS OF ADHESION;  Surgeon: Noland FordyceFogleman, Kelly, MD;  [facility] [place];  Service: Gynecology;;  ? ROBOTIC ASSISTED TOTAL HYSTERECTOMY WITH BILATERAL SALPINGO OOPHERECTOMY N/A 08/05/2020  ? Procedure: XI ROBOTIC ASSISTED TOTAL HYSTERECTOMY WITH Left SALPINGO OOPHORECTOMY/Right Salpingectomy;  Surgeon: Noland FordyceFogleman, Kelly, MD;  [facility] [place];  Service: Gynecology;  Laterality: N/A;  ? WISDOM TOOTH EXTRACTION    ? ? ?Family History  ?Problem Relation Age of Onset  ? Cancer Paternal Aunt   ?     breast  ? Diabetes Paternal Grandmother   ? Cancer Paternal Grandmother   ?     pancreatic cancer  ? ? ?No Known Allergies ? ? ?Current Medications:  ? ?Current Outpatient Medications:  ?  escitalopram (LEXAPRO) 20 MG tablet, Take 20 mg by mouth daily., Disp: , Rfl:  ?  levothyroxine (SYNTHROID) 112 MCG  tablet, Take 1 tablet (112 mcg total) by mouth daily before breakfast. TAKE 1 TABLET BY MOUTH  DAILY BEFORE BREAKFAST, Disp: 90 tablet, Rfl: 3 ?  methylphenidate (RITALIN) 20 MG tablet, Take 20 mg by mouth 2 (two) times daily., Disp: , Rfl:  ?  [START ON 02/27/2022] Semaglutide-Weight Management 0.5 MG/0.5ML SOAJ, Inject 0.5 mg into the skin once a week., Disp: 2 mL, Rfl: 2 ?  WELLBUTRIN SR 200 MG 12 hr tablet, Take 200 mg by mouth 2 (two) times daily. , Disp: , Rfl:   ? ?Review of Systems:  ? ?ROS ?Negative unless otherwise specified per HPI. ?Vitals:  ? ?Vitals:  ? 02/13/22 0855  ?BP: (!) 133/92  ?Pulse: 68  ?Temp: 98.1 ?F (36.7 ?C)  ?TempSrc: Temporal  ?SpO2: 98%  ?Weight: 189 lb (85.7 kg)  ?Height: 5\' 3"  (1.6 m)  ?   ? ?Body mass index is 33.48 kg/m?. ? ?Physical Exam:  ? ?Physical Exam ?Vitals and nursing note reviewed.  ?Constitutional:   ?   General: She is not in acute distress. ?   Appearance: She is well-developed. She is not ill-appearing or toxic-appearing.  ?Cardiovascular:  ?   Rate and Rhythm: Normal rate and regular rhythm.  ?   Pulses: Normal pulses.  ?   Heart sounds: Normal heart sounds, S1 normal and S2 normal.  ?Pulmonary:  ?   Effort: Pulmonary effort is normal.  ?   Breath sounds: Normal breath sounds.  ?Skin: ?   General: Skin is warm and dry.  ?Neurological:  ?   Mental Status: She is alert.  ?   GCS: GCS eye subscore is 4. GCS verbal subscore is 5. GCS motor subscore is 6.  ?Psychiatric:     ?   Speech: Speech normal.     ?   Behavior: Behavior normal. Behavior is cooperative.  ? ? ?Assessment and Plan:  ? ?Acquired hypothyroidism ?Stable; no red flags ?Continue synthroid 112 mcg daily  ?We are going to take over this for her ? ?Prediabetes ?Trial Wegovy 0.25 mg weekly injection-- provided patient with sample  ?May increase to [facility] 0.5 mg weekly following sample completion if comfortable  ?Follow up in 3 months, sooner if concerns  ? ?Attention deficit hyperactivity disorder (ADHD),  unspecified ADHD type ?Stable  ?Continue Ritalin 20 mg twice daily  ?Management per [facility], PA- Psychiatry  ? ?GAD (generalized anxiety disorder) ?Controlled ?No red flags;Denies SI/HI ?Continue Wellbutrin SR 200 mg twice daily and Lexapro 20 mg daily  ?Management per Tonna Boehringer, PA- Psychiatry  ?I advised patient that if they develop any SI, to tell someone immediately and seek medical attention ? ?Endometriosis determined by laparoscopy ?Reviewed ?Overall stable since her surgery ? ?I,Havlyn C Ratchford,acting as a scribe for Tonna Boehringer, PA.,have documented all relevant documentation on the behalf of [employer], PA,as directed by  Jarold Motto, PA while in the presence of Jarold Motto, Jarold Motto. ? ?I,  Jarold Motto, [place], have reviewed all documentation for this visit. The documentation on 02/13/22 for the exam, diagnosis, procedures, and orders are all accurate and complete. ? ? ?Jarold Motto, PA-C ? ? ?

## 2022-02-17 ENCOUNTER — Telehealth: Payer: Self-pay | Admitting: *Deleted

## 2022-02-17 ENCOUNTER — Ambulatory Visit: Payer: Managed Care, Other (non HMO) | Admitting: Physician Assistant

## 2022-02-17 NOTE — Telephone Encounter (Signed)
(  Key: [id number]) ?Rx #: [id number] ?Wegovy 0.5MG /0.5ML auto-injectors ?Waiting for determination  ?

## 2022-02-17 NOTE — Telephone Encounter (Signed)
PA approved from 02/17/2022 to 09/19/2022 ?Pharmacy notified  ?Approval letter send to be scan on patient chart  ?

## 2022-05-18 LAB — HM MAMMOGRAPHY

## 2022-05-19 ENCOUNTER — Encounter: Payer: Self-pay | Admitting: Physician Assistant

## 2022-05-19 ENCOUNTER — Ambulatory Visit: Payer: Managed Care, Other (non HMO) | Admitting: Physician Assistant

## 2022-05-19 VITALS — BP 122/80 | HR 79 | Temp 98.0°F | Ht 63.0 in | Wt 174.4 lb

## 2022-05-19 DIAGNOSIS — E669 Obesity, unspecified: Secondary | ICD-10-CM | POA: Diagnosis not present

## 2022-05-19 DIAGNOSIS — F909 Attention-deficit hyperactivity disorder, unspecified type: Secondary | ICD-10-CM

## 2022-05-19 DIAGNOSIS — E039 Hypothyroidism, unspecified: Secondary | ICD-10-CM

## 2022-05-19 DIAGNOSIS — F411 Generalized anxiety disorder: Secondary | ICD-10-CM | POA: Diagnosis not present

## 2022-05-19 DIAGNOSIS — J3489 Other specified disorders of nose and nasal sinuses: Secondary | ICD-10-CM

## 2022-05-19 MED ORDER — MUPIROCIN 2 % EX OINT: TOPICAL_OINTMENT | CUTANEOUS | 0 refills | Status: DC

## 2022-05-19 MED ORDER — SEMAGLUTIDE (1 MG/DOSE) 4 MG/3ML ~~LOC~~ SOPN: 1.0000 mg | PEN_INJECTOR | SUBCUTANEOUS | 1 refills | Status: DC

## 2022-05-19 MED ORDER — LEVOTHYROXINE SODIUM 112 MCG PO TABS: 112.0000 ug | ORAL_TABLET | Freq: Every day | ORAL | 1 refills | Status: DC

## 2022-05-20 ENCOUNTER — Encounter: Payer: Self-pay | Admitting: Physician Assistant

## 2022-05-21 ENCOUNTER — Telehealth: Payer: Self-pay | Admitting: *Deleted

## 2022-05-21 NOTE — Telephone Encounter (Signed)
Pt states she received notification that medication (Ozempic) PA was denied.  Pt states she was taking [place] and a prior authorization from April was processed.   Pt is out of medication. Pt has missed one injection (05/16/22) so far.    LAST APPOINTMENT DATE:  05/19/22 OV  NEXT APPOINTMENT DATE: 08/18/22 CPE  MEDICATION: Semaglutide, 1 MG/DOSE, 4 MG/3ML SOPN [[id number]]    Is the patient out of medication?  Yes - see above  PHARMACY: [id number] - [place], [place] - [address]  [address] [place], [place] [address]  Phone:  [phone]  Fax:  [phone]

## 2022-05-21 NOTE — Telephone Encounter (Signed)
Received fax from pharmacy PA needed for Ozempic 1 mg. PA done thru Covermymeds. Awaiting determination. KEY: [id number]

## 2022-05-22 MED ORDER — WEGOVY 1 MG/0.5ML ~~LOC~~ SOAJ: 1.0000 mg | SUBCUTANEOUS | 0 refills | Status: DC

## 2022-05-22 NOTE — Telephone Encounter (Signed)
Received determination:  Message from Plan Your PA request has been denied for Ozempic.

## 2022-05-22 NOTE — Telephone Encounter (Signed)
Spoke to pt told her I received fax from the pharmacy for Ozempic needing PA that is why I did it and it was denied and you are aware. Pt said she is on [facility] and needs the next dose. Told her okay I will send Rx for Wegovy 1 mg to the pharmacy. Pt verbalized understanding.

## 2022-08-17 ENCOUNTER — Encounter: Payer: Self-pay | Admitting: *Deleted

## 2022-08-18 ENCOUNTER — Ambulatory Visit (INDEPENDENT_AMBULATORY_CARE_PROVIDER_SITE_OTHER): Payer: Managed Care, Other (non HMO) | Admitting: Physician Assistant

## 2022-08-18 ENCOUNTER — Encounter: Payer: Self-pay | Admitting: Physician Assistant

## 2022-08-18 ENCOUNTER — Other Ambulatory Visit (HOSPITAL_BASED_OUTPATIENT_CLINIC_OR_DEPARTMENT_OTHER): Payer: Self-pay

## 2022-08-18 ENCOUNTER — Encounter (HOSPITAL_BASED_OUTPATIENT_CLINIC_OR_DEPARTMENT_OTHER): Payer: Self-pay | Admitting: Pharmacist

## 2022-08-18 VITALS — BP 124/80 | HR 73 | Temp 97.8°F | Ht 63.25 in | Wt 181.0 lb

## 2022-08-18 DIAGNOSIS — Z0000 Encounter for general adult medical examination without abnormal findings: Secondary | ICD-10-CM | POA: Diagnosis not present

## 2022-08-18 DIAGNOSIS — Z1159 Encounter for screening for other viral diseases: Secondary | ICD-10-CM

## 2022-08-18 DIAGNOSIS — E039 Hypothyroidism, unspecified: Secondary | ICD-10-CM | POA: Diagnosis not present

## 2022-08-18 DIAGNOSIS — E669 Obesity, unspecified: Secondary | ICD-10-CM

## 2022-08-18 LAB — COMPREHENSIVE METABOLIC PANEL
ALT: 11 U/L (ref 0–35)
AST: 12 U/L (ref 0–37)
Albumin: 4.1 g/dL (ref 3.5–5.2)
Alkaline Phosphatase: 86 U/L (ref 39–117)
BUN: 6 mg/dL (ref 6–23)
CO2: 27 mEq/L (ref 19–32)
Calcium: 8.9 mg/dL (ref 8.4–10.5)
Chloride: 106 mEq/L (ref 96–112)
Creatinine, Ser: 0.67 mg/dL (ref 0.40–1.20)
GFR: 106.2 mL/min (ref >60.00)
Glucose, Bld: 98 mg/dL (ref 70–99)
Potassium: 3.6 mEq/L (ref 3.5–5.1)
Sodium: 140 mEq/L (ref 135–145)
Total Bilirubin: 0.3 mg/dL (ref 0.2–1.2)
Total Protein: 6.8 g/dL (ref 6.0–8.3)

## 2022-08-18 LAB — CBC WITH DIFFERENTIAL/PLATELET
Basophils Absolute: 0 10*3/uL (ref 0.0–0.1)
Basophils Relative: 0.5 % (ref 0.0–3.0)
Eosinophils Absolute: 0.2 10*3/uL (ref 0.0–0.7)
Eosinophils Relative: 2.6 % (ref 0.0–5.0)
HCT: 35.6 % — ABNORMAL LOW (ref 36.0–46.0)
Hemoglobin: 12.4 g/dL (ref 12.0–15.0)
Lymphocytes Relative: 34.9 % (ref 12.0–46.0)
Lymphs Abs: 2.1 10*3/uL (ref 0.7–4.0)
MCHC: 34.7 g/dL (ref 30.0–36.0)
MCV: 86.3 fl (ref 78.0–100.0)
Monocytes Absolute: 0.3 10*3/uL (ref 0.1–1.0)
Monocytes Relative: 5 % (ref 3.0–12.0)
Neutro Abs: 3.4 10*3/uL (ref 1.4–7.7)
Neutrophils Relative %: 57 % (ref 43.0–77.0)
Platelets: 258 10*3/uL (ref 150.0–400.0)
RBC: 4.13 Mil/uL (ref 3.87–5.11)
RDW: 12.8 % (ref 11.5–15.5)
WBC: 5.9 10*3/uL (ref 4.0–10.5)

## 2022-08-18 LAB — LIPID PANEL
Cholesterol: 168 mg/dL (ref 0–200)
HDL: 67 mg/dL (ref >39.00)
LDL Cholesterol: 88 mg/dL (ref 0–99)
NonHDL: 101.19
Total CHOL/HDL Ratio: 3
Triglycerides: 68 mg/dL (ref 0.0–149.0)
VLDL: 13.6 mg/dL (ref 0.0–40.0)

## 2022-08-18 LAB — TSH: TSH: 0.97 u[IU]/mL (ref 0.35–5.50)

## 2022-08-18 MED ORDER — WEGOVY 0.5 MG/0.5ML ~~LOC~~ SOAJ
0.5000 mg | SUBCUTANEOUS | 0 refills | Status: DC
  Filled 2022-08-18: qty 2, 28d supply, fill #0

## 2022-08-18 MED ORDER — WEGOVY 1 MG/0.5ML ~~LOC~~ SOAJ
1.0000 mg | SUBCUTANEOUS | 0 refills | Status: DC
  Filled 2022-08-18: qty 6, fill #0
  Filled 2022-09-08: qty 2, 28d supply, fill #0
  Filled 2022-10-07 – 2022-10-12 (×2): qty 2, 28d supply, fill #1

## 2022-08-18 NOTE — Patient Instructions (Signed)
It was great to see you!  I'm sending in 0.5 mg Wegovy and 1.0 mg Wegovy to the [facility] -- contact them with your insurance information    Please go to the lab for blood work.   Our office will call you with your results unless you have chosen to receive results via MyChart.  If your blood work is normal we will follow-up each year for physicals and as scheduled for chronic medical problems.  If anything is abnormal we will treat accordingly and get you in for a follow-up.  Take care,  Aldona Bar

## 2022-08-18 NOTE — Progress Notes (Signed)
Subjective:    Crystal Wiley is a 46 y.o. female and is here for a comprehensive physical exam.   HPI  Health Maintenance Due  Topic Date Due   Hepatitis C Screening  Never done    Acute Concerns: None  Chronic Issues: Hypothyroidism -- currently taking levothyroxine 112 mcg daily. Denies significant concerns.  Obesity -- currently prescribed wegovy 1 mg weekly but not taking due to unavailability. End of June was last dose of wegovy 0.5 mg. She was tolerating well.  Health Maintenance: Immunizations -- will get flu shot at a later date Colonoscopy -- n/a Mammogram -- UTD PAP -- UTD Bone Density -- n/a Diet -- overall healthy diet; working on high protein diet; protein shakes; drinks a lot of water Sleep habits -- no major concerns; taking magnesium Exercise -- walking regularly especially when son has practice Weight -- Weight: 181 lb (82.1 kg)  Mood -- overall stable Weight history: Wt Readings from Last 10 Encounters:  08/18/22 181 lb (82.1 kg)  05/19/22 174 lb 6.1 oz (79.1 kg)  02/13/22 189 lb (85.7 kg)  01/07/21 194 lb 6.4 oz (88.2 kg)  08/05/20 178 lb 3.2 oz (80.8 kg)  07/25/20 185 lb (83.9 kg)  06/20/20 185 lb (83.9 kg)  01/22/20 181 lb (82.1 kg)  01/08/20 181 lb 12.8 oz (82.5 kg)  01/04/19 186 lb (84.4 kg)   Body mass index is 31.81 kg/m. Patient's last menstrual period was 02/13/2013. Alcohol use:  reports no history of alcohol use. Tobacco use:  Tobacco Use: Low Risk  (08/18/2022)   Patient History    Smoking Tobacco Use: Never    Smokeless Tobacco Use: Never    Passive Exposure: Not on file        08/18/2022    8:06 AM  Depression screen PHQ 2/9  Decreased Interest 0  Down, Depressed, Hopeless 0  PHQ - 2 Score 0     Other providers/specialists: Patient Care Team: Jarold Motto, [place] as PCP - General (Physician Assistant)    PMHx, SurgHx, SocialHx, Medications, and Allergies were reviewed in the Visit Navigator and updated as  appropriate.   Past Medical History:  Diagnosis Date   Anemia    Anxiety    Hypothyroidism    Hashimoto's   Pure hypercholesterolemia 02/13/2022   SVD (spontaneous vaginal delivery) (12/27) 11/18/2013     Past Surgical History:  Procedure Laterality Date   ABDOMINAL HYSTERECTOMY     ABLATION  08/2019   ovarian   CYSTOSCOPY WITH STENT PLACEMENT N/A 08/05/2020   Procedure: CYSTOSCOPY WITH STENT PLACEMENT, RETROGRADE PYELOGRAM;  Surgeon: Rene Paci, MD;  Location: [facility];  Service: Urology;  Laterality: N/A;   FLEXIBLE SIGMOIDOSCOPY N/A 08/05/2020   Procedure: RIGID SIGMOIDOSCOPY;  Surgeon: Noland Fordyce, MD;  Location: [facility];  Service: Gynecology;  Laterality: N/A;   LYSIS OF ADHESION  08/05/2020   Procedure: ENTEROLYSIS OF ADHESION;  Surgeon: Noland Fordyce, MD;  Location: [facility];  Service: Gynecology;;   ROBOTIC ASSISTED TOTAL HYSTERECTOMY WITH BILATERAL SALPINGO OOPHERECTOMY N/A 08/05/2020   Procedure: XI ROBOTIC ASSISTED TOTAL HYSTERECTOMY WITH Left SALPINGO OOPHORECTOMY/Right Salpingectomy;  Surgeon: Noland Fordyce, MD;  Location: [facility];  Service: Gynecology;  Laterality: N/A;   WISDOM TOOTH EXTRACTION       Family History  Problem Relation Age of Onset   Cancer Paternal Aunt        breast   Diabetes Paternal Grandmother    Cancer  Paternal Grandmother        pancreatic cancer    Social History   Tobacco Use   Smoking status: Never   Smokeless tobacco: Never  Vaping Use   Vaping Use: Never used  Substance Use Topics   Alcohol use: No   Drug use: No    Review of Systems:   Review of Systems  Constitutional:  Negative for chills, fever, malaise/fatigue and weight loss.  HENT:  Negative for hearing loss, sinus pain and sore throat.   Respiratory:  Negative for cough and hemoptysis.   Cardiovascular:  Negative for chest pain, palpitations, leg swelling and PND.   Gastrointestinal:  Negative for abdominal pain, constipation, diarrhea, heartburn, nausea and vomiting.  Genitourinary:  Negative for dysuria, frequency and urgency.  Musculoskeletal:  Negative for back pain, myalgias and neck pain.  Skin:  Negative for itching and rash.  Neurological:  Negative for dizziness, tingling, seizures and headaches.  Endo/Heme/Allergies:  Negative for polydipsia.  Psychiatric/Behavioral:  Negative for depression. The patient is not nervous/anxious.     Objective:   BP 124/80 (BP Location: Left Arm, Patient Position: Sitting, Cuff Size: Normal)   Pulse 73   Temp 97.8 F (36.6 C) (Temporal)   Ht 5' 3.25" (1.607 m)   Wt 181 lb (82.1 kg)   LMP 02/13/2013   SpO2 98%   BMI 31.81 kg/m  Body mass index is 31.81 kg/m.   General Appearance:    Alert, cooperative, no distress, appears stated age  Head:    Normocephalic, without obvious abnormality, atraumatic  Eyes:    PERRL, conjunctiva/corneas clear, EOM's intact, fundi    benign, both eyes  Ears:    Normal TM's and external ear canals, both ears  Nose:   Nares normal, septum midline, mucosa normal, no drainage    or sinus tenderness  Throat:   Lips, mucosa, and tongue normal; teeth and gums normal  Neck:   Supple, symmetrical, trachea midline, no adenopathy;    thyroid:  no enlargement/tenderness/nodules; no carotid   bruit or JVD  Back:     Symmetric, no curvature, ROM normal, no CVA tenderness  Lungs:     Clear to auscultation bilaterally, respirations unlabored  Chest Wall:    No tenderness or deformity   Heart:    Regular rate and rhythm, S1 and S2 normal, no murmur, rub or gallop  Breast Exam:    Deferred  Abdomen:     Soft, non-tender, bowel sounds active all four quadrants,    no masses, no organomegaly  Genitalia:    Deferred  Extremities:   Extremities normal, atraumatic, no cyanosis or edema  Pulses:   2+ and symmetric all extremities  Skin:   Skin color, texture, turgor normal, no rashes  or lesions  Lymph nodes:   Cervical, supraclavicular, and axillary nodes normal  Neurologic:   CNII-XII intact, normal strength, sensation and reflexes    throughout    Assessment/Plan:   Routine physical examination Today patient counseled on age appropriate routine health concerns for screening and prevention, each reviewed and up to date or declined. Immunizations reviewed and up to date or declined. Labs ordered and reviewed. Risk factors for depression reviewed and negative. Hearing function and visual acuity are intact. ADLs screened and addressed as needed. Functional ability and level of safety reviewed and appropriate. Education, counseling and referrals performed based on assessed risks today. Patient provided with a copy of personalized plan for preventive services.  Obesity, unspecified classification, unspecified obesity type,  unspecified whether serious comorbidity present Continue efforts at healthy lifestyle Will restart Wegovy and send to [facility] Follow-up in 3 months, sooner if concerns  Acquired hypothyroidism Update TSH and adjust 112 mcg levothyroxine accordingly  Encounter for screening for other viral diseases Update Hep C  Patient Counseling: [x]    Nutrition: Stressed importance of moderation in sodium/caffeine intake, saturated fat and cholesterol, caloric balance, sufficient intake of fresh fruits, vegetables, fiber, calcium, iron, and 1 mg of folate supplement per day (for females capable of pregnancy).  [x]    Stressed the importance of regular exercise.   [x]    Substance Abuse: Discussed cessation/primary prevention of tobacco, alcohol, or other drug use; driving or other dangerous activities under the influence; availability of treatment for abuse.   [x]    Injury prevention: Discussed safety belts, safety helmets, smoke detector, smoking near bedding or upholstery.   [x]    Sexuality: Discussed sexually transmitted diseases, partner selection, use of  condoms, avoidance of unintended pregnancy  and contraceptive alternatives.  [x]    Dental health: Discussed importance of regular tooth brushing, flossing, and dental visits.  [x]    Health maintenance and immunizations reviewed. Please refer to Health maintenance section.   , PA-C [facility]

## 2022-08-19 LAB — HEPATITIS C ANTIBODY: Hepatitis C Ab: NONREACTIVE

## 2022-09-08 ENCOUNTER — Other Ambulatory Visit (HOSPITAL_BASED_OUTPATIENT_CLINIC_OR_DEPARTMENT_OTHER): Payer: Self-pay

## 2022-10-09 ENCOUNTER — Other Ambulatory Visit (HOSPITAL_BASED_OUTPATIENT_CLINIC_OR_DEPARTMENT_OTHER): Payer: Self-pay

## 2022-10-13 ENCOUNTER — Other Ambulatory Visit (HOSPITAL_BASED_OUTPATIENT_CLINIC_OR_DEPARTMENT_OTHER): Payer: Self-pay

## 2022-10-20 ENCOUNTER — Other Ambulatory Visit (HOSPITAL_BASED_OUTPATIENT_CLINIC_OR_DEPARTMENT_OTHER): Payer: Self-pay

## 2022-10-26 ENCOUNTER — Telehealth: Payer: Self-pay | Admitting: Physician Assistant

## 2022-10-26 ENCOUNTER — Other Ambulatory Visit (HOSPITAL_BASED_OUTPATIENT_CLINIC_OR_DEPARTMENT_OTHER): Payer: Self-pay

## 2022-10-26 MED ORDER — WEGOVY 1 MG/0.5ML ~~LOC~~ SOAJ
1.0000 mg | SUBCUTANEOUS | 0 refills | Status: DC
  Filled 2022-10-26 – 2022-10-28 (×2): qty 2, 28d supply, fill #0
  Filled 2022-12-02: qty 2, 28d supply, fill #1
  Filled 2022-12-30: qty 2, 28d supply, fill #2

## 2022-10-26 NOTE — Telephone Encounter (Signed)
PA for Wegovy 1 mg done thru Covermymeds. Awaiting response.

## 2022-10-26 NOTE — Telephone Encounter (Signed)
Patient states the Pharmacy told Patient the following RX requests needs PA and that the Pharmacy has requested refill/PA since 10/13/22:   LAST APPOINTMENT DATE:  Please schedule appointment if longer than 1 year  08/18/22  NEXT APPOINTMENT DATE:  08/20/23  MEDICATION:  Semaglutide-Weight Management (WEGOVY) 1 MG/0.5ML SOAJ   Is the patient out of medication? Yes  PHARMACY: [facility] Phone: [phone]  Fax: [phone]     Let patient know to contact pharmacy at the end of the day to make sure medication is ready.  Please notify patient to allow 48-72 hours to process

## 2022-10-27 ENCOUNTER — Other Ambulatory Visit (HOSPITAL_BASED_OUTPATIENT_CLINIC_OR_DEPARTMENT_OTHER): Payer: Self-pay

## 2022-10-28 ENCOUNTER — Encounter (HOSPITAL_BASED_OUTPATIENT_CLINIC_OR_DEPARTMENT_OTHER): Payer: Self-pay | Admitting: Pharmacist

## 2022-10-28 ENCOUNTER — Other Ambulatory Visit (HOSPITAL_BASED_OUTPATIENT_CLINIC_OR_DEPARTMENT_OTHER): Payer: Self-pay

## 2022-10-30 ENCOUNTER — Other Ambulatory Visit (HOSPITAL_BASED_OUTPATIENT_CLINIC_OR_DEPARTMENT_OTHER): Payer: Self-pay

## 2022-11-03 NOTE — Telephone Encounter (Signed)
Received response PA for [facility] was denied. Appeal done thru Covermymeds. Awaiting response.

## 2022-11-04 NOTE — Telephone Encounter (Signed)
Mhung W-reviewing Pharmacist with [facility]- at ph# [phone],  Ext [phone] regarding [phone]  Requests to be called by 4:30 pm (not in the am if after 11/04/22)

## 2022-11-05 ENCOUNTER — Other Ambulatory Visit: Payer: Self-pay | Admitting: Physician Assistant

## 2022-11-06 NOTE — Telephone Encounter (Signed)
Tried to contact [facility] , message said not available and hung up.

## 2022-11-09 ENCOUNTER — Other Ambulatory Visit (HOSPITAL_BASED_OUTPATIENT_CLINIC_OR_DEPARTMENT_OTHER): Payer: Self-pay

## 2022-11-09 NOTE — Telephone Encounter (Signed)
Called [facility] pharmacy spoke to Deja told her PA for [facility] 1 mg has been approved. PA approved 11/05/2022 through 06/06/2023. Deja verbalized understanding.

## 2022-11-09 NOTE — Telephone Encounter (Signed)
Spoke to pt told her Crystal Wiley has been approved for 7 months and pharmacy was notified. Pt verbalized understanding.

## 2022-11-09 NOTE — Telephone Encounter (Signed)
Called and spoke to pharmacist [facility], told her returning your call about Appeal for pt. Mhung said Reginal Lutes has been approved for 7 months, effective 11/05/2022 to 06/06/2023. Told her okay thank you.

## 2022-12-02 ENCOUNTER — Other Ambulatory Visit (HOSPITAL_BASED_OUTPATIENT_CLINIC_OR_DEPARTMENT_OTHER): Payer: Self-pay

## 2022-12-03 ENCOUNTER — Other Ambulatory Visit (HOSPITAL_BASED_OUTPATIENT_CLINIC_OR_DEPARTMENT_OTHER): Payer: Self-pay

## 2022-12-03 MED ORDER — LISDEXAMFETAMINE DIMESYLATE 30 MG PO CAPS
30.0000 mg | ORAL_CAPSULE | Freq: Every day | ORAL | 0 refills | Status: DC
  Filled 2022-12-03: qty 30, 30d supply, fill #0

## 2022-12-25 ENCOUNTER — Other Ambulatory Visit (HOSPITAL_BASED_OUTPATIENT_CLINIC_OR_DEPARTMENT_OTHER): Payer: Self-pay

## 2022-12-25 MED ORDER — LISDEXAMFETAMINE DIMESYLATE 40 MG PO CAPS
40.0000 mg | ORAL_CAPSULE | Freq: Every day | ORAL | 0 refills | Status: DC
  Filled 2022-12-25: qty 30, 30d supply, fill #0

## 2022-12-30 ENCOUNTER — Other Ambulatory Visit (HOSPITAL_BASED_OUTPATIENT_CLINIC_OR_DEPARTMENT_OTHER): Payer: Self-pay

## 2023-01-22 ENCOUNTER — Other Ambulatory Visit (HOSPITAL_BASED_OUTPATIENT_CLINIC_OR_DEPARTMENT_OTHER): Payer: Self-pay

## 2023-01-22 MED ORDER — LISDEXAMFETAMINE DIMESYLATE 50 MG PO CAPS
50.0000 mg | ORAL_CAPSULE | Freq: Every day | ORAL | 0 refills | Status: DC
  Filled 2023-01-22: qty 30, 30d supply, fill #0

## 2023-01-26 ENCOUNTER — Other Ambulatory Visit: Payer: Self-pay | Admitting: *Deleted

## 2023-01-26 ENCOUNTER — Other Ambulatory Visit: Payer: Self-pay | Admitting: Physician Assistant

## 2023-01-26 ENCOUNTER — Other Ambulatory Visit (HOSPITAL_BASED_OUTPATIENT_CLINIC_OR_DEPARTMENT_OTHER): Payer: Self-pay

## 2023-01-26 MED ORDER — WEGOVY 1.7 MG/0.75ML ~~LOC~~ SOAJ
1.7000 mg | SUBCUTANEOUS | 0 refills | Status: DC
  Filled 2023-01-26: qty 3, 28d supply, fill #0

## 2023-02-19 ENCOUNTER — Other Ambulatory Visit: Payer: Self-pay

## 2023-02-19 ENCOUNTER — Other Ambulatory Visit (HOSPITAL_BASED_OUTPATIENT_CLINIC_OR_DEPARTMENT_OTHER): Payer: Self-pay

## 2023-02-19 MED ORDER — LISDEXAMFETAMINE DIMESYLATE 60 MG PO CAPS
60.0000 mg | ORAL_CAPSULE | Freq: Every day | ORAL | 0 refills | Status: DC
  Filled 2023-02-19: qty 30, 30d supply, fill #0

## 2023-02-23 ENCOUNTER — Other Ambulatory Visit: Payer: Self-pay

## 2023-02-25 ENCOUNTER — Other Ambulatory Visit: Payer: Self-pay | Admitting: Physician Assistant

## 2023-02-26 ENCOUNTER — Other Ambulatory Visit (HOSPITAL_BASED_OUTPATIENT_CLINIC_OR_DEPARTMENT_OTHER): Payer: Self-pay

## 2023-02-26 MED ORDER — WEGOVY 1.7 MG/0.75ML ~~LOC~~ SOAJ
1.7000 mg | SUBCUTANEOUS | 0 refills | Status: DC
  Filled 2023-02-26: qty 3, 28d supply, fill #0

## 2023-03-16 ENCOUNTER — Other Ambulatory Visit (HOSPITAL_BASED_OUTPATIENT_CLINIC_OR_DEPARTMENT_OTHER): Payer: Self-pay

## 2023-03-16 MED ORDER — LISDEXAMFETAMINE DIMESYLATE 60 MG PO CAPS
60.0000 mg | ORAL_CAPSULE | Freq: Every day | ORAL | 0 refills | Status: DC
  Filled 2023-03-25: qty 30, 30d supply, fill #0

## 2023-03-25 ENCOUNTER — Other Ambulatory Visit (HOSPITAL_BASED_OUTPATIENT_CLINIC_OR_DEPARTMENT_OTHER): Payer: Self-pay

## 2023-03-25 ENCOUNTER — Other Ambulatory Visit: Payer: Self-pay | Admitting: Physician Assistant

## 2023-03-25 MED ORDER — WEGOVY 2.4 MG/0.75ML ~~LOC~~ SOAJ
2.4000 mg | SUBCUTANEOUS | 0 refills | Status: DC
  Filled 2023-03-25: qty 3, 28d supply, fill #0

## 2023-03-25 NOTE — Telephone Encounter (Signed)
Spoke to pt asked her if she wants to increase dose of Wegovy to 2.4 mg. Pt said yes. Told her I will send next dose in. Pt verbalized understanding.

## 2023-03-26 ENCOUNTER — Other Ambulatory Visit (HOSPITAL_BASED_OUTPATIENT_CLINIC_OR_DEPARTMENT_OTHER): Payer: Self-pay

## 2023-04-13 ENCOUNTER — Other Ambulatory Visit (HOSPITAL_BASED_OUTPATIENT_CLINIC_OR_DEPARTMENT_OTHER): Payer: Self-pay

## 2023-04-13 MED ORDER — LISDEXAMFETAMINE DIMESYLATE 60 MG PO CAPS
60.0000 mg | ORAL_CAPSULE | Freq: Every day | ORAL | 0 refills | Status: DC
  Filled 2023-04-27: qty 30, 30d supply, fill #0

## 2023-04-13 MED ORDER — VYVANSE 60 MG PO CAPS
60.0000 mg | ORAL_CAPSULE | Freq: Every day | ORAL | 0 refills | Status: DC
  Filled 2023-06-23 – 2023-06-26 (×3): qty 30, 30d supply, fill #0

## 2023-04-13 MED ORDER — LISDEXAMFETAMINE DIMESYLATE 60 MG PO CAPS
60.0000 mg | ORAL_CAPSULE | Freq: Every day | ORAL | 0 refills | Status: DC
  Filled 2023-04-27 – 2023-05-28 (×2): qty 30, 30d supply, fill #0

## 2023-04-23 ENCOUNTER — Telehealth: Payer: Self-pay | Admitting: Physician Assistant

## 2023-04-23 ENCOUNTER — Other Ambulatory Visit (HOSPITAL_BASED_OUTPATIENT_CLINIC_OR_DEPARTMENT_OTHER): Payer: Self-pay

## 2023-04-23 MED ORDER — WEGOVY 2.4 MG/0.75ML ~~LOC~~ SOAJ
2.4000 mg | SUBCUTANEOUS | 0 refills | Status: DC
  Filled 2023-04-23: qty 3, 28d supply, fill #0
  Filled 2023-05-22: qty 3, 28d supply, fill #1

## 2023-04-23 NOTE — Telephone Encounter (Signed)
Spoke to pt asked her if she is on Wegovy 2.4 mg? Pt said yes. Told pt I will send in 3 month supply, but not sure if insurance will allow it to be dispensed, clarified pharmacy. Pt verbalized understanding. Rx sent.

## 2023-04-23 NOTE — Telephone Encounter (Signed)
Patient states insurance informed her they will no longer cover Wegovy as of next month. Pt requests a 3 month supply be sent in. Please advise.

## 2023-04-26 ENCOUNTER — Other Ambulatory Visit (HOSPITAL_BASED_OUTPATIENT_CLINIC_OR_DEPARTMENT_OTHER): Payer: Self-pay

## 2023-04-27 ENCOUNTER — Other Ambulatory Visit (HOSPITAL_BASED_OUTPATIENT_CLINIC_OR_DEPARTMENT_OTHER): Payer: Self-pay

## 2023-04-28 ENCOUNTER — Other Ambulatory Visit (HOSPITAL_BASED_OUTPATIENT_CLINIC_OR_DEPARTMENT_OTHER): Payer: Self-pay

## 2023-04-29 ENCOUNTER — Other Ambulatory Visit (HOSPITAL_COMMUNITY): Payer: Self-pay

## 2023-04-29 ENCOUNTER — Other Ambulatory Visit (HOSPITAL_BASED_OUTPATIENT_CLINIC_OR_DEPARTMENT_OTHER): Payer: Self-pay

## 2023-05-03 ENCOUNTER — Other Ambulatory Visit (HOSPITAL_BASED_OUTPATIENT_CLINIC_OR_DEPARTMENT_OTHER): Payer: Self-pay

## 2023-05-24 ENCOUNTER — Encounter (HOSPITAL_BASED_OUTPATIENT_CLINIC_OR_DEPARTMENT_OTHER): Payer: Self-pay

## 2023-05-24 ENCOUNTER — Other Ambulatory Visit (HOSPITAL_BASED_OUTPATIENT_CLINIC_OR_DEPARTMENT_OTHER): Payer: Self-pay

## 2023-05-28 ENCOUNTER — Other Ambulatory Visit (HOSPITAL_BASED_OUTPATIENT_CLINIC_OR_DEPARTMENT_OTHER): Payer: Self-pay

## 2023-05-29 ENCOUNTER — Other Ambulatory Visit (HOSPITAL_BASED_OUTPATIENT_CLINIC_OR_DEPARTMENT_OTHER): Payer: Self-pay

## 2023-05-30 ENCOUNTER — Other Ambulatory Visit (HOSPITAL_BASED_OUTPATIENT_CLINIC_OR_DEPARTMENT_OTHER): Payer: Self-pay

## 2023-05-31 ENCOUNTER — Other Ambulatory Visit (HOSPITAL_COMMUNITY): Payer: Self-pay

## 2023-06-01 ENCOUNTER — Encounter: Payer: Self-pay | Admitting: Obstetrics

## 2023-06-01 ENCOUNTER — Telehealth: Payer: Self-pay | Admitting: *Deleted

## 2023-06-01 ENCOUNTER — Other Ambulatory Visit (HOSPITAL_BASED_OUTPATIENT_CLINIC_OR_DEPARTMENT_OTHER): Payer: Self-pay

## 2023-06-01 LAB — HM MAMMOGRAPHY

## 2023-06-01 NOTE — Telephone Encounter (Signed)
Pt needs PA for Wegovy 2.4 mg to be renewed. PA is in Covermymeds. Thanks

## 2023-06-02 ENCOUNTER — Telehealth: Payer: Self-pay | Admitting: Pharmacy Technician

## 2023-06-02 ENCOUNTER — Other Ambulatory Visit (HOSPITAL_COMMUNITY): Payer: Self-pay

## 2023-06-02 NOTE — Telephone Encounter (Signed)
Pharmacy Patient Advocate Encounter   Received notification from CoverMyMeds that prior authorization for [facility] 2.4MG /0.75ML auto-injectors is required/requested.   Insurance verification completed.   The patient is insured through [facility] .   Per test claim:   PA started via CoverMyMeds. KEY [id number] . Waiting for clinical questions to populate.

## 2023-06-02 NOTE — Telephone Encounter (Signed)
Initiated PA via [email or web address]. Per insurance, the requested medication is not covered by the [facility] pharmacy, as it is excluded from coverage.

## 2023-06-02 NOTE — Telephone Encounter (Signed)
Spoke to pt told her PA for [facility] 2.4 mg has been denied by insurance, the requested medication is not covered by the [facility] pharmacy, as it is excluded from coverage. Pt verbalized understanding.

## 2023-06-02 NOTE — Telephone Encounter (Signed)
PA cancelled in [facility]. Must use [email or web address].  Initiated PA via [email or web address]. Per insurance, the requested medication is not covered by the [facility] pharmacy, as it is excluded from coverage. Provider aware.

## 2023-06-19 ENCOUNTER — Other Ambulatory Visit (HOSPITAL_BASED_OUTPATIENT_CLINIC_OR_DEPARTMENT_OTHER): Payer: Self-pay

## 2023-06-22 LAB — COLOGUARD: Cologuard: NEGATIVE

## 2023-06-23 ENCOUNTER — Other Ambulatory Visit (HOSPITAL_BASED_OUTPATIENT_CLINIC_OR_DEPARTMENT_OTHER): Payer: Self-pay

## 2023-06-24 ENCOUNTER — Other Ambulatory Visit (HOSPITAL_BASED_OUTPATIENT_CLINIC_OR_DEPARTMENT_OTHER): Payer: Self-pay

## 2023-06-25 ENCOUNTER — Other Ambulatory Visit (HOSPITAL_BASED_OUTPATIENT_CLINIC_OR_DEPARTMENT_OTHER): Payer: Self-pay

## 2023-06-26 ENCOUNTER — Other Ambulatory Visit (HOSPITAL_BASED_OUTPATIENT_CLINIC_OR_DEPARTMENT_OTHER): Payer: Self-pay

## 2023-06-28 ENCOUNTER — Other Ambulatory Visit (HOSPITAL_BASED_OUTPATIENT_CLINIC_OR_DEPARTMENT_OTHER): Payer: Self-pay

## 2023-06-28 LAB — EXTERNAL GENERIC LAB PROCEDURE: COLOGUARD: NEGATIVE

## 2023-06-28 LAB — COLOGUARD: COLOGUARD: NEGATIVE

## 2023-06-29 ENCOUNTER — Other Ambulatory Visit (HOSPITAL_BASED_OUTPATIENT_CLINIC_OR_DEPARTMENT_OTHER): Payer: Self-pay

## 2023-06-29 MED ORDER — LISDEXAMFETAMINE DIMESYLATE 60 MG PO CAPS
60.0000 mg | ORAL_CAPSULE | Freq: Every day | ORAL | 0 refills | Status: DC
  Filled 2023-07-30: qty 30, 30d supply, fill #0

## 2023-07-08 ENCOUNTER — Encounter (INDEPENDENT_AMBULATORY_CARE_PROVIDER_SITE_OTHER): Payer: Self-pay

## 2023-07-30 ENCOUNTER — Other Ambulatory Visit (HOSPITAL_BASED_OUTPATIENT_CLINIC_OR_DEPARTMENT_OTHER): Payer: Self-pay

## 2023-08-09 ENCOUNTER — Other Ambulatory Visit (HOSPITAL_COMMUNITY): Payer: Self-pay

## 2023-08-20 ENCOUNTER — Encounter: Payer: Self-pay | Admitting: Physician Assistant

## 2023-08-20 ENCOUNTER — Ambulatory Visit (INDEPENDENT_AMBULATORY_CARE_PROVIDER_SITE_OTHER): Payer: Managed Care, Other (non HMO) | Admitting: Physician Assistant

## 2023-08-20 VITALS — BP 130/86 | HR 85 | Temp 97.3°F | Ht 63.0 in | Wt 166.4 lb

## 2023-08-20 DIAGNOSIS — E663 Overweight: Secondary | ICD-10-CM

## 2023-08-20 DIAGNOSIS — Z0000 Encounter for general adult medical examination without abnormal findings: Secondary | ICD-10-CM | POA: Diagnosis not present

## 2023-08-20 DIAGNOSIS — E039 Hypothyroidism, unspecified: Secondary | ICD-10-CM

## 2023-08-20 DIAGNOSIS — F909 Attention-deficit hyperactivity disorder, unspecified type: Secondary | ICD-10-CM

## 2023-08-20 DIAGNOSIS — F411 Generalized anxiety disorder: Secondary | ICD-10-CM

## 2023-08-20 DIAGNOSIS — M25531 Pain in right wrist: Secondary | ICD-10-CM

## 2023-08-20 LAB — LAB REPORT - SCANNED
A1c: 5.4
EGFR: 111

## 2023-08-20 MED ORDER — BUPROPION HCL ER (XL) 300 MG PO TB24: 300.0000 mg | ORAL_TABLET | Freq: Every day | ORAL | 1 refills | Status: DC

## 2023-08-20 NOTE — Patient Instructions (Addendum)
It was great to see you!  After your next appointment with your psychiatrist, if you would like to transfer to me     Take care,  [facility]

## 2023-08-20 NOTE — Progress Notes (Signed)
Subjective:    Crystal Wiley is a 47 y.o. female and is here for a comprehensive physical exam.  HPI  Health Maintenance Due  Topic Date Due   MAMMOGRAM  05/19/2023    Acute Concerns: Possible Carpel tunnel Patient reports pain in right wrist and states that she has been experiencing right arm and hand tingling/numbness when she sleeps. She mentions that since experiencing this, she has began sleeping on her left side. Patient believes it may be carpel tunnel because she experienced the same pain during pregnancy.  Chronic Issues: Overweight Patient reports that she was on wegovy for 7 months before she stopped due to insurance. She has since gained 5-6 pounds and is inquiring about other weight loss options.   ADHD Patient is compliant with 60 mg Vyvanse with no adverse side effects.   Anxiety/Depression Patient is compliant with 10 mg Lexapro and 200 mg Wellbutrin twice daily with no adverse side effects.    Hypothyroidism  Patient is compliant with 112 mg synthroid with no adverse side effects.  Health Maintenance: Cologuard -- last completed on 06/22/2023. Mammogram -- last completed on 06/01/2023. Diet -- She reports that she eats well, but has trouble with snacking. Exercise -- Patient states that she walks outside of her work building about three times a week.   Sleep habits -- Reports no trouble with sleeping. Mood -- She is in a stable mood this visit.  UTD with dentist? - She is UTD with dental care. UTD with eye doctor? - She is UTD with vision care.  Weight history: Wt Readings from Last 10 Encounters:  08/20/23 166 lb 6.1 oz (75.5 kg)  08/18/22 181 lb (82.1 kg)  05/19/22 174 lb 6.1 oz (79.1 kg)  02/13/22 189 lb (85.7 kg)  01/07/21 194 lb 6.4 oz (88.2 kg)  08/05/20 178 lb 3.2 oz (80.8 kg)  07/25/20 185 lb (83.9 kg)  06/20/20 185 lb (83.9 kg)  01/22/20 181 lb (82.1 kg)  01/08/20 181 lb 12.8 oz (82.5 kg)   Body mass index is 29.47 kg/m. Patient's  last menstrual period was 02/13/2013.  Alcohol use:  reports no history of alcohol use.  Tobacco use:  Tobacco Use: Low Risk  (08/20/2023)   Patient History    Smoking Tobacco Use: Never    Smokeless Tobacco Use: Never    Passive Exposure: Not on file   Eligible for lung cancer screening? no     08/20/2023    8:23 AM  Depression screen PHQ 2/9  Decreased Interest 0  Down, Depressed, Hopeless 0  PHQ - 2 Score 0  Altered sleeping 0  Tired, decreased energy 3  Change in appetite 2  Feeling bad or failure about yourself  0  Trouble concentrating 2  Moving slowly or fidgety/restless 0  Suicidal thoughts 0  PHQ-9 Score 7  Difficult doing work/chores Somewhat difficult     Other providers/specialists: Patient Care Team: Jarold Motto, [place] as PCP - General (Physician Assistant)    PMHx, SurgHx, SocialHx, Medications, and Allergies were reviewed in the Visit Navigator and updated as appropriate.   Past Medical History:  Diagnosis Date   Anemia    Anxiety    Hypothyroidism    Hashimoto's   Pure hypercholesterolemia 02/13/2022   SVD (spontaneous vaginal delivery) (12/27) 11/18/2013     Past Surgical History:  Procedure Laterality Date   ABDOMINAL HYSTERECTOMY     ABLATION  08/2019   ovarian   CYSTOSCOPY WITH STENT PLACEMENT N/A 08/05/2020  Procedure: CYSTOSCOPY WITH STENT PLACEMENT, RETROGRADE PYELOGRAM;  Surgeon: Rene Paci, MD;  Location: [facility];  Service: Urology;  Laterality: N/A;   FLEXIBLE SIGMOIDOSCOPY N/A 08/05/2020   Procedure: RIGID SIGMOIDOSCOPY;  Surgeon: Noland Fordyce, MD;  Location: [facility];  Service: Gynecology;  Laterality: N/A;   LYSIS OF ADHESION  08/05/2020   Procedure: ENTEROLYSIS OF ADHESION;  Surgeon: Noland Fordyce, MD;  Location: [facility];  Service: Gynecology;;   ROBOTIC ASSISTED TOTAL HYSTERECTOMY WITH BILATERAL SALPINGO OOPHERECTOMY N/A 08/05/2020   Procedure: XI  ROBOTIC ASSISTED TOTAL HYSTERECTOMY WITH Left SALPINGO OOPHORECTOMY/Right Salpingectomy;  Surgeon: Noland Fordyce, MD;  Location: [facility];  Service: Gynecology;  Laterality: N/A;   WISDOM TOOTH EXTRACTION       Family History  Problem Relation Age of Onset   Diabetes Paternal Grandmother    Cancer Paternal Grandmother        pancreatic cancer   Cancer Paternal Aunt        breast   Colon cancer Neg Hx     Social History   Tobacco Use   Smoking status: Never   Smokeless tobacco: Never  Vaping Use   Vaping status: Never Used  Substance Use Topics   Alcohol use: No   Drug use: No    Review of Systems:   Review of Systems  Constitutional:  Negative for chills, fever, malaise/fatigue and weight loss.  HENT:  Negative for hearing loss, sinus pain and sore throat.   Respiratory:  Negative for cough and hemoptysis.   Cardiovascular:  Negative for chest pain, palpitations, leg swelling and PND.  Gastrointestinal:  Negative for abdominal pain, constipation, diarrhea, heartburn, nausea and vomiting.  Genitourinary:  Negative for dysuria, frequency and urgency.  Musculoskeletal:  Negative for back pain, myalgias and neck pain.  Skin:  Negative for itching and rash.  Endo/Heme/Allergies:  Negative for polydipsia.  Psychiatric/Behavioral:  Negative for depression. The patient is not nervous/anxious.     Objective:   BP 130/86 (BP Location: Left Arm, Patient Position: Sitting, Cuff Size: Normal)   Pulse 85   Temp (!) 97.3 F (36.3 C) (Temporal)   Ht 5\' 3"  (1.6 m)   Wt 166 lb 6.1 oz (75.5 kg)   LMP 02/13/2013   SpO2 99%   BMI 29.47 kg/m  Body mass index is 29.47 kg/m.   General Appearance:    Alert, cooperative, no distress, appears stated age  Head:    Normocephalic, without obvious abnormality, atraumatic  Eyes:    PERRL, conjunctiva/corneas clear, EOM's intact, fundi    benign, both eyes  Ears:    Normal TM's and external ear canals, both ears   Nose:   Nares normal, septum midline, mucosa normal, no drainage    or sinus tenderness  Throat:   Lips, mucosa, and tongue normal; teeth and gums normal  Neck:   Supple, symmetrical, trachea midline, no adenopathy;    thyroid:  no enlargement/tenderness/nodules; no carotid   bruit or JVD  Back:     Symmetric, no curvature, ROM normal, no CVA tenderness  Lungs:     Clear to auscultation bilaterally, respirations unlabored  Chest Wall:    No tenderness or deformity   Heart:    Regular rate and rhythm, S1 and S2 normal, no murmur, rub or gallop  Breast Exam:    Deferred   Abdomen:     Soft, non-tender, bowel sounds active all four quadrants,    no masses, no organomegaly  Genitalia:    Deferred  Extremities:   Extremities normal, atraumatic, no cyanosis or edema  Pulses:   2+ and symmetric all extremities  Skin:   Skin color, texture, turgor normal, no rashes or lesions  Lymph nodes:   Cervical, supraclavicular, and axillary nodes normal  Neurologic:   CNII-XII intact, normal strength, sensation and reflexes    throughout    Assessment/Plan:   Routine physical examination Today patient counseled on age appropriate routine health concerns for screening and prevention, each reviewed and up to date or declined. Immunizations reviewed and up to date or declined. Labs ordered and reviewed. Risk factors for depression reviewed and negative. Hearing function and visual acuity are intact. ADLs screened and addressed as needed. Functional ability and level of safety reviewed and appropriate. Education, counseling and referrals performed based on assessed risks today. Patient provided with a copy of personalized plan for preventive services.  Attention deficit hyperactivity disorder (ADHD), unspecified ADHD type Well controlled Management per psychiatry however I'm happy to take over and discussed this with her today  GAD (generalized anxiety disorder) Well controlled Management per psychiatry  however I'm happy to take over and discussed this with her today  Acquired hypothyroidism Update TSH (blood work done yesterday -- she will send results) Adjust levothyroxine 112 mcg based on results  Right wrist pain Suspect possible ulnar compression or carpal tunnel Recommend preventing arm bending at night and consideration of wrist brace If new/worsening symptom(s), reach out and will refer to sports medicine  Overweight Improved Offered Zepbound vials -- she will reach out to me if she wants this and will prescribe  I,Verona Buck,acting as a scribe for [employer], PA.,have documented all relevant documentation on the behalf of Jarold Motto, PA,as directed by  Jarold Motto, PA while in the presence of Jarold Motto, [place].  I, Jarold Motto, [place], have reviewed all documentation for this visit. The documentation on 08/20/23 for the exam, diagnosis, procedures, and orders are all accurate and complete.   Jarold Motto, PA-C [facility]

## 2023-08-24 ENCOUNTER — Other Ambulatory Visit (HOSPITAL_BASED_OUTPATIENT_CLINIC_OR_DEPARTMENT_OTHER): Payer: Self-pay

## 2023-08-24 MED ORDER — LISDEXAMFETAMINE DIMESYLATE 70 MG PO CAPS
70.0000 mg | ORAL_CAPSULE | Freq: Every day | ORAL | 0 refills | Status: DC
  Filled 2023-08-24: qty 30, 30d supply, fill #0

## 2023-09-10 ENCOUNTER — Other Ambulatory Visit: Payer: Self-pay | Admitting: *Deleted

## 2023-09-10 MED ORDER — LEVOTHYROXINE SODIUM 112 MCG PO TABS: 112.0000 ug | ORAL_TABLET | Freq: Every day | ORAL | 1 refills | Status: DC

## 2023-09-22 ENCOUNTER — Other Ambulatory Visit (HOSPITAL_BASED_OUTPATIENT_CLINIC_OR_DEPARTMENT_OTHER): Payer: Self-pay

## 2023-09-22 MED ORDER — LISDEXAMFETAMINE DIMESYLATE 70 MG PO CAPS
70.0000 mg | ORAL_CAPSULE | Freq: Every day | ORAL | 0 refills | Status: DC
  Filled 2023-09-22: qty 30, 30d supply, fill #0

## 2023-10-18 ENCOUNTER — Other Ambulatory Visit (HOSPITAL_BASED_OUTPATIENT_CLINIC_OR_DEPARTMENT_OTHER): Payer: Self-pay

## 2023-10-18 MED ORDER — LISDEXAMFETAMINE DIMESYLATE 70 MG PO CAPS
70.0000 mg | ORAL_CAPSULE | Freq: Every day | ORAL | 0 refills | Status: DC
Start: 2023-10-20 — End: 2023-11-30
  Filled 2023-10-25: qty 30, 30d supply, fill #0

## 2023-10-25 ENCOUNTER — Other Ambulatory Visit (HOSPITAL_BASED_OUTPATIENT_CLINIC_OR_DEPARTMENT_OTHER): Payer: Self-pay

## 2023-10-25 MED ORDER — FLUCONAZOLE 150 MG PO TABS
150.0000 mg | ORAL_TABLET | ORAL | 0 refills | Status: DC
  Filled 2023-10-25: qty 2, 3d supply, fill #0

## 2023-11-26 ENCOUNTER — Ambulatory Visit: Payer: Managed Care, Other (non HMO) | Admitting: Physician Assistant

## 2023-11-30 ENCOUNTER — Other Ambulatory Visit: Payer: Self-pay

## 2023-11-30 ENCOUNTER — Telehealth (INDEPENDENT_AMBULATORY_CARE_PROVIDER_SITE_OTHER): Payer: Managed Care, Other (non HMO) | Admitting: Physician Assistant

## 2023-11-30 ENCOUNTER — Other Ambulatory Visit (HOSPITAL_BASED_OUTPATIENT_CLINIC_OR_DEPARTMENT_OTHER): Payer: Self-pay

## 2023-11-30 ENCOUNTER — Encounter: Payer: Self-pay | Admitting: Physician Assistant

## 2023-11-30 VITALS — Ht 63.0 in | Wt 168.0 lb

## 2023-11-30 DIAGNOSIS — F411 Generalized anxiety disorder: Secondary | ICD-10-CM

## 2023-11-30 DIAGNOSIS — F909 Attention-deficit hyperactivity disorder, unspecified type: Secondary | ICD-10-CM

## 2023-11-30 MED ORDER — LISDEXAMFETAMINE DIMESYLATE 70 MG PO CAPS
70.0000 mg | ORAL_CAPSULE | Freq: Every day | ORAL | 0 refills | Status: DC
  Filled 2024-02-15: qty 30, 30d supply, fill #0

## 2023-11-30 MED ORDER — BUPROPION HCL ER (XL) 150 MG PO TB24
150.0000 mg | ORAL_TABLET | Freq: Every day | ORAL | 3 refills | Status: DC
Start: 2023-11-30 — End: 2024-04-06
  Filled 2023-11-30: qty 90, 90d supply, fill #0
  Filled 2024-03-17: qty 90, 90d supply, fill #1

## 2023-11-30 MED ORDER — LISDEXAMFETAMINE DIMESYLATE 70 MG PO CAPS
70.0000 mg | ORAL_CAPSULE | Freq: Every day | ORAL | 0 refills | Status: DC
  Filled 2024-01-10: qty 30, 30d supply, fill #0

## 2023-11-30 MED ORDER — ESCITALOPRAM OXALATE 10 MG PO TABS
10.0000 mg | ORAL_TABLET | Freq: Every day | ORAL | 3 refills | Status: DC
  Filled 2023-11-30 – 2024-03-17 (×2): qty 90, 90d supply, fill #0

## 2023-11-30 MED ORDER — LISDEXAMFETAMINE DIMESYLATE 70 MG PO CAPS
70.0000 mg | ORAL_CAPSULE | Freq: Every day | ORAL | 0 refills | Status: DC
  Filled 2023-11-30: qty 30, 30d supply, fill #0

## 2023-11-30 NOTE — Progress Notes (Signed)
 I acted as a neurosurgeon for [employer], PA-C Arland Chute, LPN  Virtual Visit via Video Note   I, Lucie Buttner, PA, connected with  Crystal Wiley  ([id number], 1976/11/12) on 11/30/23 at 10:40 AM EST by a video-enabled telemedicine application and verified that I am speaking with the correct person using two identifiers.  Location: Patient: Home Provider: [facility] office   I discussed the limitations of evaluation and management by telemedicine and the availability of in person appointments. The patient expressed understanding and agreed to proceed.    History of Present Illness: Crystal Wiley is a 48 y.o. who identifies as a female who was assigned female at birth, and is being seen today for ADHD and anxiety/depression.  ADHD Currently taking Vyvanse  70 mg daily Tolerating well Takes for both Attention Deficit Hyperactivity Disorder (ADHD) and binge-eating symptom(s) Denies negative side effects She has been taking this dosage for several months and tolerating without issue  Generalized anxiety disorder Currently taking Wellbutrin  150 mg xl daily (could not tolerate Wellbutrin  300 mg xl daily -- made her too anxious). Continues on Lexapro  10 mg daily Denies suicidal ideation/hi  Problems:  Patient Active Problem List   Diagnosis Date Noted   Obesity 02/13/2022   Endometriosis determined by laparoscopy 02/13/2022   Pure hypercholesterolemia 02/13/2022   S/P hysterectomy with oophorectomy 08/05/2020   Vitamin D  deficiency 01/08/2020   Iron deficiency anemia 08/04/2017   Acquired hypothyroidism 09/10/2015   Prediabetes 09/10/2015    Allergies: No Known Allergies Medications:  Current Outpatient Medications:    azelastine (ASTELIN) 0.1 % nasal spray, azelastine 137 mcg (0.1 %) nasal spray aerosol  2 SPRAYS EACH NOSTRIL TWICE DAILY AS NEEDED, Disp: , Rfl:    buPROPion  (WELLBUTRIN  XL) 150 MG 24 hr tablet, Take 1 tablet (150 mg total) by mouth daily.,  Disp: 90 tablet, Rfl: 3   buPROPion  (WELLBUTRIN  XL) 300 MG 24 hr tablet, Take 1 tablet (300 mg total) by mouth daily., Disp: 90 tablet, Rfl: 1   cholecalciferol (VITAMIN D3) 25 MCG (1000 UNIT) tablet, Take 1,000 Units by mouth daily., Disp: , Rfl:    Drospirenone (SLYND) 4 MG TABS, Slynd 4 mg (28) tablet  Take 1 tablet every day by oral route., Disp: , Rfl:    levothyroxine  (SYNTHROID ) 112 MCG tablet, Take 1 tablet (112 mcg total) by mouth daily before breakfast., Disp: 90 tablet, Rfl: 1   lisdexamfetamine (VYVANSE ) 70 MG capsule, Take 1 capsule (70 mg total) by mouth daily before breakfast., Disp: 30 capsule, Rfl: 0   [START ON 12/30/2023] lisdexamfetamine (VYVANSE ) 70 MG capsule, Take 1 capsule (70 mg total) by mouth daily before breakfast., Disp: 30 capsule, Rfl: 0   [START ON 01/29/2024] lisdexamfetamine (VYVANSE ) 70 MG capsule, Take 1 capsule (70 mg total) by mouth daily before breakfast., Disp: 30 capsule, Rfl: 0   Magnesium Glycinate 100 MG CAPS, Take by mouth., Disp: , Rfl:    escitalopram  (LEXAPRO ) 10 MG tablet, Take 1 tablet (10 mg total) by mouth daily., Disp: 90 tablet, Rfl: 3  Observations/Objective: Patient is well-developed, well-nourished in no acute distress.  Resting comfortably  at home.  Head is normocephalic, atraumatic.  No labored breathing.  Speech is clear and coherent with logical content.  Patient is alert and oriented at baseline.   Assessment and Plan: 1. Attention deficit hyperactivity disorder (ADHD), unspecified ADHD type (Primary) No red flags Will continue Vyvanse  70 mg daily Prescription drug monitoring program without red flags Follow-up in  3-6 months, sooner if concerns  2. GAD (generalized anxiety disorder) Overall controlled Will update prescriptions to reflect what she is now currently taking -- 150 mg xl Wellbutrin  daily and Lexapro  10 mg daily Follow-up in 3-6 month(s), sooner if concerns   Follow Up Instructions: I discussed the assessment and  treatment plan with the patient. The patient was provided an opportunity to ask questions and all were answered. The patient agreed with the plan and demonstrated an understanding of the instructions.  A copy of instructions were sent to the patient via MyChart unless otherwise noted below.   The patient was advised to call back or seek an in-person evaluation if the symptoms worsen or if the condition fails to improve as anticipated.  Lucie Buttner, [place]

## 2024-01-10 ENCOUNTER — Other Ambulatory Visit (HOSPITAL_BASED_OUTPATIENT_CLINIC_OR_DEPARTMENT_OTHER): Payer: Self-pay

## 2024-02-15 ENCOUNTER — Other Ambulatory Visit (HOSPITAL_BASED_OUTPATIENT_CLINIC_OR_DEPARTMENT_OTHER): Payer: Self-pay

## 2024-03-17 ENCOUNTER — Other Ambulatory Visit: Payer: Self-pay | Admitting: Physician Assistant

## 2024-03-17 ENCOUNTER — Other Ambulatory Visit (HOSPITAL_BASED_OUTPATIENT_CLINIC_OR_DEPARTMENT_OTHER): Payer: Self-pay

## 2024-03-17 MED ORDER — LISDEXAMFETAMINE DIMESYLATE 70 MG PO CAPS
70.0000 mg | ORAL_CAPSULE | Freq: Every day | ORAL | 0 refills | Status: DC
  Filled 2024-03-17: qty 30, 30d supply, fill #0

## 2024-03-17 NOTE — Telephone Encounter (Signed)
 Pt requesting refill for Vyvanse  70 mg. Last OV 11/30/2023

## 2024-03-18 ENCOUNTER — Other Ambulatory Visit (HOSPITAL_BASED_OUTPATIENT_CLINIC_OR_DEPARTMENT_OTHER): Payer: Self-pay

## 2024-03-20 ENCOUNTER — Other Ambulatory Visit: Payer: Self-pay | Admitting: *Deleted

## 2024-03-20 MED ORDER — LEVOTHYROXINE SODIUM 112 MCG PO TABS: 112.0000 ug | ORAL_TABLET | Freq: Every day | ORAL | 1 refills | Status: DC

## 2024-03-29 LAB — CBC AND DIFFERENTIAL
HCT: 38 (ref 36–46)
Hemoglobin: 12.5 (ref 12.0–16.0)
Neutrophils Absolute: 3.7
Platelets: 295 10*3/uL (ref 150–400)
WBC: 6.3

## 2024-03-29 LAB — COMPREHENSIVE METABOLIC PANEL WITH GFR
Albumin: 4.2 (ref 3.5–5.0)
Calcium: 9.2 (ref 8.7–10.7)
Globulin: 2.2
eGFR: 111

## 2024-03-29 LAB — HEMOGLOBIN A1C: Hemoglobin A1C: 5.2

## 2024-03-29 LAB — VITAMIN D 25 HYDROXY (VIT D DEFICIENCY, FRACTURES): Vit D, 25-Hydroxy: 32.9

## 2024-03-29 LAB — LIPID PANEL
Cholesterol: 193 (ref 0–200)
HDL: 83 — AB (ref 35–70)
LDL Cholesterol: 99
LDl/HDL Ratio: 1.2
Triglycerides: 58 (ref 40–160)

## 2024-03-29 LAB — CBC: RBC: 4.3 (ref 3.87–5.11)

## 2024-03-29 LAB — BASIC METABOLIC PANEL WITH GFR
BUN: 8 (ref 4–21)
CO2: 22 (ref 13–22)
Chloride: 103 (ref 99–108)
Creatinine: 0.6 (ref 0.5–1.1)
Glucose: 89
Potassium: 3.8 meq/L (ref 3.5–5.1)
Sodium: 139 (ref 137–147)

## 2024-03-29 LAB — HEPATIC FUNCTION PANEL
ALT: 12 U/L (ref 7–35)
AST: 14 (ref 13–35)
Alkaline Phosphatase: 109 (ref 25–125)
Bilirubin, Total: 0.3

## 2024-03-29 LAB — TSH: TSH: 0.31 — AB (ref 0.41–5.90)

## 2024-04-06 ENCOUNTER — Encounter: Payer: Self-pay | Admitting: Physician Assistant

## 2024-04-06 ENCOUNTER — Ambulatory Visit: Admitting: Physician Assistant

## 2024-04-06 ENCOUNTER — Other Ambulatory Visit (HOSPITAL_BASED_OUTPATIENT_CLINIC_OR_DEPARTMENT_OTHER): Payer: Self-pay

## 2024-04-06 VITALS — BP 140/90 | HR 69 | Temp 97.7°F | Ht 63.0 in | Wt 168.0 lb

## 2024-04-06 DIAGNOSIS — R030 Elevated blood-pressure reading, without diagnosis of hypertension: Secondary | ICD-10-CM

## 2024-04-06 DIAGNOSIS — M25512 Pain in left shoulder: Secondary | ICD-10-CM | POA: Diagnosis not present

## 2024-04-06 DIAGNOSIS — F909 Attention-deficit hyperactivity disorder, unspecified type: Secondary | ICD-10-CM

## 2024-04-06 DIAGNOSIS — E039 Hypothyroidism, unspecified: Secondary | ICD-10-CM

## 2024-04-06 DIAGNOSIS — F411 Generalized anxiety disorder: Secondary | ICD-10-CM

## 2024-04-06 LAB — THYROXINE (T4) FREE, DIRECT
T3, Free: 3.3
T4,Free (Direct): 1.37

## 2024-04-06 LAB — T3, FREE: T3, Free: 3.3

## 2024-04-06 MED ORDER — AZSTARYS 26.1-5.2 MG PO CAPS
1.0000 | ORAL_CAPSULE | Freq: Every day | ORAL | 0 refills | Status: DC
  Filled 2024-04-06: qty 30, 30d supply, fill #0

## 2024-04-06 MED ORDER — BUPROPION HCL ER (SR) 200 MG PO TB12
200.0000 mg | ORAL_TABLET | Freq: Two times a day (BID) | ORAL | 2 refills | Status: DC
  Filled 2024-04-06: qty 60, 30d supply, fill #0

## 2024-04-06 NOTE — Patient Instructions (Addendum)
 It was great to see you!  Recheck TSH in one month -- please message me results  Continue Lexapro  10 Change to Wellbutrin  200 mg twice daily   We will try to send in new stimulant today -- if/when you get this, stop Vyvanse  and start this  A referral has been placed for you to see one of our fantastic providers at [facility]. Someone from their office will be in touch soon regarding scheduling your appointment.  Their location:  [facility]  [address] on the 1st floor Phone number [phone] Fax [phone].   This location is across the street from the entrance to [employer] and in the same complex as the [facility]  Your blood pressure is elevated in our office today.  I recommend that you monitor this at home.  Your goal blood pressure should be around < 130/80, unless you are over 30 years old, your goal may be closer to 140-150/90. Please note if you have been given other goals from a cardiologist or other healthcare provider, please defer to their recommendations.  When preparing to take your blood pressure: Plan ahead. Don't smoke, drink caffeine or exercise within 30 minutes before taking your blood pressure. Empty your bladder. Don't take the measurement over clothes. Remove the clothing over the arm that will be used to measure blood pressure. You can use either arm unless otherwise told by a healthcare provider. Usually there is not a big difference between readings on them. Be still. Allow at least five minutes of quiet rest before measurements. Don't talk or use the phone. Sit correctly. Sit with your back straight and supported (on a dining chair, rather than a sofa). Your feet should be flat on the floor. Do not cross your legs. Support your arm on a flat surface. The middle of the cuff should be placed on the upper arm at heart level.  Measure at the same time of the day. Take multiple  readings and record the results. Each time you measure, take two readings one minute apart. Record the results and bring in to your next office visit.  In order to know how well the medication is working, I would like you to take your readings 1-2 hours after taking your blood pressure medication if possible. Take your blood pressure measurements and record 2-3 days per week.  If you get a high blood pressure reading: A single high reading is not an immediate cause for alarm. If you get a reading that is higher than normal, take your blood pressure a second time. Write down the results of both measurements. Check with your health care professional to see if there's a health concern or whether there may be problems with your monitor. If your blood pressure readings are suddenly higher than 180/120 mm Hg, wait at least one minute and test again. If your readings are still very high, contact your health care professional immediately. You could be having a hypertensive crisis. Call 911 if your blood pressure is higher than 180/120 mm Hg and if you are having new signs or symptoms that may include: Chest pain Shortness of breath Back pain Numbness Weakness Change in vision Difficulty speaking Confusion Dizziness Vomiting       Let's follow-up in 3 months, sooner if you have concerns.  Take care,  Alexander Iba PA-C

## 2024-04-06 NOTE — Progress Notes (Signed)
 Crystal Wiley is a 48 y.o. female here for a follow up of a pre-existing problem.  History of Present Illness:   Chief Complaint  Patient presents with   ADHD    Pt currently on Vyvanse  70 mg.   Anxiety    Pt c/o increase in anxiety for the past 3 months, currently taking Wellbutrin  150 mg since Jan. Pt was on Wellbutrin  300 mg daily prior.     Elevated blood pressure reading Currently not on medication. At home blood pressure readings are: not checked. Patient denies chest pain, SOB, blurred vision, dizziness, unusual headaches, lower leg swelling. Denies excessive caffeine intake, stimulant usage, excessive alcohol intake, or increase in salt consumption.  BP Readings from Last 3 Encounters:  04/06/24 (!) 140/90  08/20/23 130/86  08/18/22 124/80    ADHD Currently taking Vyvanse  70 mg daily She does not feel like this medication is helping with her ADHD symptoms anymore It does help with her food noise   Anxiety She is currently taking Wellbutrin  150 mg extended release daily and Lexapro  10 mg daily She feels her symptoms are uncontrolled She has tried Lexapro  20 mg in the past but this caused weight gain Paxil caused weird dreams and Prozac caused a feeling of numbness   Left shoulder pain She is having ongoing issues with her left shoulder She has been having issues with range of motion especially with overhead movements She does not take anything for her symptoms but tries to avoid aggressive activities She notices pain at nighttime as well Denies any significant numbness, tingling, weakness in this shoulder/arm   Hypothyroidism She is currently taking levothyroxine  112 mcg daily She recently had blood work done through her work that showed a TSH of 0.35 She does admit to recently having the flu and missed her thyroid  medication for 1 week  Past Medical History:  Diagnosis Date   Anemia    Anxiety    Hypothyroidism    Hashimoto's   Pure hypercholesterolemia  02/13/2022   SVD (spontaneous vaginal delivery) (12/27) 11/18/2013     Social History   Tobacco Use   Smoking status: Never   Smokeless tobacco: Never  Vaping Use   Vaping status: Never Used  Substance Use Topics   Alcohol use: No   Drug use: No    Past Surgical History:  Procedure Laterality Date   ABDOMINAL HYSTERECTOMY     ABLATION  08/2019   ovarian   CYSTOSCOPY WITH STENT PLACEMENT N/A 08/05/2020   Procedure: CYSTOSCOPY WITH STENT PLACEMENT, RETROGRADE PYELOGRAM;  Surgeon: Adelbert Homans, MD;  Location: [facility];  Service: Urology;  Laterality: N/A;   FLEXIBLE SIGMOIDOSCOPY N/A 08/05/2020   Procedure: RIGID SIGMOIDOSCOPY;  Surgeon: Audelia Leaks, MD;  Location: [facility];  Service: Gynecology;  Laterality: N/A;   LYSIS OF ADHESION  08/05/2020   Procedure: ENTEROLYSIS OF ADHESION;  Surgeon: Audelia Leaks, MD;  Location: [facility] [place];  Service: Gynecology;;   ROBOTIC ASSISTED TOTAL HYSTERECTOMY WITH BILATERAL SALPINGO OOPHERECTOMY N/A 08/05/2020   Procedure: XI ROBOTIC ASSISTED TOTAL HYSTERECTOMY WITH Left SALPINGO OOPHORECTOMY/Right Salpingectomy;  Surgeon: Audelia Leaks, MD;  Location: [facility];  Service: Gynecology;  Laterality: N/A;   WISDOM TOOTH EXTRACTION      Family History  Problem Relation Age of Onset   Diabetes Paternal Grandmother    Cancer Paternal Grandmother        pancreatic cancer   Cancer Paternal Aunt  breast   Colon cancer Neg Hx     No Known Allergies  Current Medications:   Current Outpatient Medications:    buPROPion  (WELLBUTRIN  SR) 200 MG 12 hr tablet, Take 1 tablet (200 mg total) by mouth 2 (two) times daily., Disp: 60 tablet, Rfl: 2   cholecalciferol (VITAMIN D3) 25 MCG (1000 UNIT) tablet, Take 1,000 Units by mouth daily., Disp: , Rfl:    escitalopram  (LEXAPRO ) 10 MG tablet, Take 1 tablet (10 mg total) by mouth daily., Disp: 90 tablet, Rfl: 3    levothyroxine  (SYNTHROID ) 112 MCG tablet, Take 1 tablet (112 mcg total) by mouth daily before breakfast., Disp: 90 tablet, Rfl: 1   Magnesium Glycinate 100 MG CAPS, Take by mouth., Disp: , Rfl:    Serdexmethylphen-Dexmethylphen (AZSTARYS) 26.1-5.2 MG CAPS, Take 1 capsule by mouth daily., Disp: 30 capsule, Rfl: 0   Review of Systems:   Negative unless otherwise specified per HPI.  Vitals:   Vitals:   04/06/24 0833 04/06/24 0918  BP: (!) 140/90 (!) 140/90  Pulse: 69   Temp: 97.7 F (36.5 C)   TempSrc: Temporal   SpO2: 99%   Weight: 168 lb (76.2 kg)   Height: 5\' 3"  (1.6 m)      Body mass index is 29.76 kg/m.  Physical Exam:   Physical Exam Vitals and nursing note reviewed.  Constitutional:      General: She is not in acute distress.    Appearance: She is well-developed. She is not ill-appearing or toxic-appearing.  Cardiovascular:     Rate and Rhythm: Normal rate and regular rhythm.     Pulses: Normal pulses.     Heart sounds: Normal heart sounds, S1 normal and S2 normal.  Pulmonary:     Effort: Pulmonary effort is normal.     Breath sounds: Normal breath sounds.  Musculoskeletal:     Comments: Decreased abduction of left arm due to pain Tenderness to anterior aspect of left shoulder  Skin:    General: Skin is warm and dry.  Neurological:     Mental Status: She is alert.     GCS: GCS eye subscore is 4. GCS verbal subscore is 5. GCS motor subscore is 6.  Psychiatric:        Speech: Speech normal.        Behavior: Behavior normal. Behavior is cooperative.     Assessment and Plan:   Elevated blood pressure reading Above goal today No evidence of end-organ damage on my exam Recommend patient monitor home blood pressure at least a few times weekly If home monitoring shows consistent elevation, or any symptom(s) develop, recommend reach out to us  for further advice on next steps  Attention deficit hyperactivity disorder (ADHD), unspecified ADHD  type Uncontrolled We are going to try to get Azstarys approved I have advised for her to start this once she receives it and stop Vyvanse  Follow-up in 3 months, sooner if concerns  GAD (generalized anxiety disorder) Uncontrolled We are going to switch back to her 200 mg Wellbutrin  twice daily dosing You are going to continue her Lexapro  10 mg daily She is hoping that a change in her ADHD medication will help improve her anxiety We may consider switching to Trintellix in the future if Azstarys does not get approved Follow-up in 3 months, sooner if concerns  Acute pain of left shoulder Will refer to sports medicine for further evaluation  Acquired hypothyroidism She does not want to change her thyroid  dose at this time because it  has been stable outside of her most recent lab visit I have asked her to recheck her TSH in 1 month and report back the findings and we will go from there In the meantime continue 112 mcg levothyroxine  daily    Alexander Iba, PA-C

## 2024-04-19 ENCOUNTER — Encounter: Payer: Self-pay | Admitting: Physician Assistant

## 2024-04-19 ENCOUNTER — Other Ambulatory Visit: Payer: Self-pay | Admitting: Physician Assistant

## 2024-04-19 MED ORDER — AZSTARYS 39.2-7.8 MG PO CAPS: 1.0000 | ORAL_CAPSULE | Freq: Every day | ORAL | 0 refills | Status: DC

## 2024-05-02 ENCOUNTER — Other Ambulatory Visit: Payer: Self-pay | Admitting: *Deleted

## 2024-05-02 MED ORDER — BUPROPION HCL ER (SR) 200 MG PO TB12: 200.0000 mg | ORAL_TABLET | Freq: Two times a day (BID) | ORAL | 1 refills | Status: DC

## 2024-05-17 ENCOUNTER — Other Ambulatory Visit (HOSPITAL_COMMUNITY): Payer: Self-pay

## 2024-05-23 ENCOUNTER — Encounter: Payer: Self-pay | Admitting: Physician Assistant

## 2024-06-06 ENCOUNTER — Encounter: Payer: Self-pay | Admitting: Physician Assistant

## 2024-06-06 ENCOUNTER — Ambulatory Visit: Admitting: Physician Assistant

## 2024-06-06 ENCOUNTER — Other Ambulatory Visit (HOSPITAL_BASED_OUTPATIENT_CLINIC_OR_DEPARTMENT_OTHER): Payer: Self-pay

## 2024-06-06 VITALS — BP 136/94 | HR 79 | Temp 97.9°F | Ht 63.0 in | Wt 179.8 lb

## 2024-06-06 DIAGNOSIS — R030 Elevated blood-pressure reading, without diagnosis of hypertension: Secondary | ICD-10-CM

## 2024-06-06 DIAGNOSIS — F411 Generalized anxiety disorder: Secondary | ICD-10-CM

## 2024-06-06 DIAGNOSIS — R110 Nausea: Secondary | ICD-10-CM

## 2024-06-06 DIAGNOSIS — F909 Attention-deficit hyperactivity disorder, unspecified type: Secondary | ICD-10-CM

## 2024-06-06 DIAGNOSIS — M25512 Pain in left shoulder: Secondary | ICD-10-CM

## 2024-06-06 DIAGNOSIS — E039 Hypothyroidism, unspecified: Secondary | ICD-10-CM

## 2024-06-06 MED ORDER — LISDEXAMFETAMINE DIMESYLATE 70 MG PO CAPS
70.0000 mg | ORAL_CAPSULE | Freq: Every day | ORAL | 0 refills | Status: DC
Start: 2024-06-06 — End: 2024-07-10
  Filled 2024-06-06: qty 30, 30d supply, fill #0

## 2024-06-06 MED ORDER — AMPHETAMINE-DEXTROAMPHETAMINE 10 MG PO TABS
5.0000 mg | ORAL_TABLET | Freq: Every day | ORAL | 0 refills | Status: DC
  Filled 2024-06-06: qty 60, 30d supply, fill #0

## 2024-06-06 MED ORDER — ONDANSETRON HCL 4 MG PO TABS
4.0000 mg | ORAL_TABLET | Freq: Three times a day (TID) | ORAL | 0 refills | Status: DC | PRN
  Filled 2024-06-06: qty 9, 30d supply, fill #0

## 2024-06-06 MED ORDER — ESCITALOPRAM OXALATE 10 MG PO TABS
10.0000 mg | ORAL_TABLET | Freq: Every day | ORAL | 3 refills | Status: AC
  Filled 2024-06-06: qty 90, 90d supply, fill #0
  Filled 2024-09-07: qty 90, 90d supply, fill #1
  Filled 2024-12-22: qty 90, 90d supply, fill #2

## 2024-06-06 NOTE — Patient Instructions (Signed)
 SABRA

## 2024-06-06 NOTE — Progress Notes (Signed)
 Crystal Wiley is a 48 y.o. female here for a follow up of a pre-existing problem.  History of Present Illness:   Chief Complaint  Patient presents with   ADHD    Patient tried different medication that wasn't working, states you wanted her to come in to discuss different medications. Patient wants to discuss nausea medication as well.     HPI  ADHD  Patient continues to struggle with managing her ADHD symptoms.  Most recently. She had tried Azstarys  26.1 and 39.2 mg but she states that she has not seen any improvements with. Denies experiencing any side effects with it.   She was previously on Vyvanse  70 mg daily.  Patient was also previously on Ritalin 20 mg in 2023 and was then switched to Adderall SR 10 mg 2x daily as previous medication did not improve symptoms. She was then switched to Vyvanse  to aid in binge eating.  She also complains of gaining about 10 pounds since discontinuing Vyvanse .    Nausea  Patient complains of experiencing frequent nausea. She states that she often has to wake up in the middle of the night due to her symptoms.   She denies any accompanying abdominal pain at this point.  Reports taking anti-reflux medications only when needed.   Elevated BP  BP is 132/94 today She is wondering if her weight gain is contributing to this.  Denis any chest pain or palpitations.   Weight Gain  Patient complains of gaining about 10 pounds since discontinuing Vyvanse .   Her insurance does not currently cover Wegovy , she is however planning to use a compounding medication. Will recheck thyroid  in her next visit. Currently on levothyroxine  112 mcg daily.   GAD  Patient reports compliance and good tolerance of Wellbutrin  150 mg xl daily and Lexapro  10 mg. Requesting refills today.   Past Medical History:  Diagnosis Date   Anemia    Anxiety    Hypothyroidism    Hashimoto's   Pure hypercholesterolemia 02/13/2022   SVD (spontaneous vaginal delivery) (12/27)  11/18/2013     Social History   Tobacco Use   Smoking status: Never   Smokeless tobacco: Never  Vaping Use   Vaping status: Never Used  Substance Use Topics   Alcohol use: No   Drug use: No    Past Surgical History:  Procedure Laterality Date   ABDOMINAL HYSTERECTOMY     ABLATION  08/2019   ovarian   CYSTOSCOPY WITH STENT PLACEMENT N/A 08/05/2020   Procedure: CYSTOSCOPY WITH STENT PLACEMENT, RETROGRADE PYELOGRAM;  Surgeon: Devere Lonni Righter, MD;  [facility];  Service: Urology;  Laterality: N/A;   FLEXIBLE SIGMOIDOSCOPY N/A 08/05/2020   Procedure: RIGID SIGMOIDOSCOPY;  Surgeon: Kandyce Sor, MD;  Location: [facility];  Service: Gynecology;  Laterality: N/A;   LYSIS OF ADHESION  08/05/2020   Procedure: ENTEROLYSIS OF ADHESION;  Surgeon: Kandyce Sor, MD;  Location: [facility] [place];  Service: Gynecology;;   ROBOTIC ASSISTED TOTAL HYSTERECTOMY WITH BILATERAL SALPINGO OOPHERECTOMY N/A 08/05/2020   Procedure: XI ROBOTIC ASSISTED TOTAL HYSTERECTOMY WITH Left SALPINGO OOPHORECTOMY/Right Salpingectomy;  Surgeon: Kandyce Sor, MD;  Location: [facility];  Service: Gynecology;  Laterality: N/A;   WISDOM TOOTH EXTRACTION      Family History  Problem Relation Age of Onset   Diabetes Paternal Grandmother    Cancer Paternal Grandmother        pancreatic cancer   Cancer Paternal Aunt  breast   Colon cancer Neg Hx     No Known Allergies  Current Medications:   Current Outpatient Medications:    amphetamine -dextroamphetamine  (ADDERALL) 10 MG tablet, Take 0.5-2 tablets at lunch as needed., Disp: 60 tablet, Rfl: 0   buPROPion  (WELLBUTRIN  SR) 200 MG 12 hr tablet, Take 1 tablet (200 mg total) by mouth 2 (two) times daily., Disp: 180 tablet, Rfl: 1   cholecalciferol (VITAMIN D3) 25 MCG (1000 UNIT) tablet, Take 1,000 Units by mouth daily., Disp: , Rfl:    levothyroxine  (SYNTHROID ) 112 MCG tablet, Take  1 tablet (112 mcg total) by mouth daily before breakfast., Disp: 90 tablet, Rfl: 1   lisdexamfetamine (VYVANSE ) 70 MG capsule, Take 1 capsule (70 mg total) by mouth daily., Disp: 30 capsule, Rfl: 0   Magnesium Glycinate 100 MG CAPS, Take by mouth., Disp: , Rfl:    omeprazole (PRILOSEC) 20 MG capsule, Take 20 mg by mouth daily., Disp: , Rfl:    ondansetron  (ZOFRAN ) 4 MG tablet, Take 1 tablet (4 mg total) by mouth every 8 (eight) hours as needed for nausea or vomiting., Disp: 20 tablet, Rfl: 0   escitalopram  (LEXAPRO ) 10 MG tablet, Take 1 tablet (10 mg total) by mouth daily., Disp: 90 tablet, Rfl: 3   Review of Systems:   Review of Systems  Cardiovascular:  Negative for chest pain and palpitations.  Gastrointestinal:  Positive for nausea.  Negative unless otherwise specified per HPI.  Vitals:   Vitals:   06/06/24 0824 06/06/24 0853  BP: (!) 132/94 (!) 136/94  Pulse: 79   Temp: 97.9 F (36.6 C)   TempSrc: Temporal   SpO2: 98%   Weight: 179 lb 12.8 oz (81.6 kg)   Height: 5' 3 (1.6 m)      Body mass index is 31.85 kg/m.  Physical Exam:   Physical Exam Vitals and nursing note reviewed.  Constitutional:      General: She is not in acute distress.    Appearance: She is well-developed. She is not ill-appearing or toxic-appearing.  Cardiovascular:     Rate and Rhythm: Normal rate and regular rhythm.     Pulses: Normal pulses.     Heart sounds: Normal heart sounds, S1 normal and S2 normal.  Pulmonary:     Effort: Pulmonary effort is normal.     Breath sounds: Normal breath sounds.  Skin:    General: Skin is warm and dry.  Neurological:     Mental Status: She is alert.     GCS: GCS eye subscore is 4. GCS verbal subscore is 5. GCS motor subscore is 6.  Psychiatric:        Speech: Speech normal.        Behavior: Behavior normal. Behavior is cooperative.     Assessment and Plan:   Elevated blood pressure reading Above goal today No evidence of end-organ damage on my  exam Recommend patient monitor home blood pressure at least a few times weekly If home monitoring shows consistent elevation, or any symptom(s) develop, recommend reach out to us  for further advice on next steps Follow up in 1-3 months, sooner if concerns  Attention deficit hyperactivity disorder (ADHD), unspecified ADHD type Uncontrolled Restart Vyvanse  70 mg daily add 5-20 mg Adderall immediate release at lunch Reach out via MyChart with results after a few weeks so we can check on progress  GAD (generalized anxiety disorder) Well controlled Continue Lexapro  10 mg daily today  Acute pain of left shoulder Referral to sports medicine  Acquired hypothyroidism Update TSH at future visit -- she has the ability to get this done through work  Nausea Recommend daily omeprazole Will trial as needed Zofran  If new/worsening symptom(s), we may need to consider gallbladder work-up   Lucie Buttner, PA-C  I,Safa M Kadhim,acting as a scribe for Lucie Buttner, PA.,have documented all relevant documentation on the behalf of Lucie Buttner, PA,as directed by  Lucie Buttner, PA while in the presence of Lucie Buttner, [place].   I, Lucie Buttner, [place], have reviewed all documentation for this visit. The documentation on 06/06/24 for the exam, diagnosis, procedures, and orders are all accurate and complete.

## 2024-06-07 ENCOUNTER — Ambulatory Visit: Admitting: Physician Assistant

## 2024-06-26 NOTE — Progress Notes (Deleted)
    [facility] [address] [place] [address] Phone: [phone]   Assessment and Plan:     There are no diagnoses linked to this encounter.  ***   Pertinent previous records reviewed include ***    Follow Up: ***     Subjective:   I, Crystal Wiley, am serving as a Neurosurgeon for Crystal Wiley  Chief Complaint: L arm pain   HPI:  06/27/2024 Patient is a 48 year old female with L arm pain. Patient states  Relevant Historical Information: ***  Additional pertinent review of systems negative.   Current Outpatient Medications:    amphetamine -dextroamphetamine  (ADDERALL) 10 MG tablet, Take 0.5-2 tablets (5-20 mg total) by mouth daily with lunch as needed, Disp: 60 tablet, Rfl: 0   buPROPion  (WELLBUTRIN  SR) 200 MG 12 hr tablet, Take 1 tablet (200 mg total) by mouth 2 (two) times daily., Disp: 180 tablet, Rfl: 1   cholecalciferol (VITAMIN D3) 25 MCG (1000 UNIT) tablet, Take 1,000 Units by mouth daily., Disp: , Rfl:    escitalopram  (LEXAPRO ) 10 MG tablet, Take 1 tablet (10 mg total) by mouth daily., Disp: 90 tablet, Rfl: 3   levothyroxine  (SYNTHROID ) 112 MCG tablet, Take 1 tablet (112 mcg total) by mouth daily before breakfast., Disp: 90 tablet, Rfl: 1   lisdexamfetamine (VYVANSE ) 70 MG capsule, Take 1 capsule (70 mg total) by mouth daily., Disp: 30 capsule, Rfl: 0   Magnesium Glycinate 100 MG CAPS, Take by mouth., Disp: , Rfl:    omeprazole (PRILOSEC) 20 MG capsule, Take 20 mg by mouth daily., Disp: , Rfl:    ondansetron  (ZOFRAN ) 4 MG tablet, Take 1 tablet (4 mg total) by mouth every 8 (eight) hours as needed for nausea or vomiting., Disp: 20 tablet, Rfl: 0   Objective:     There were no vitals filed for this visit.    There is no height or weight on file to calculate BMI.    Physical Exam:    ***   Electronically signed by:  Odis Wiley [facility] 7:47 AM 06/26/24

## 2024-06-27 ENCOUNTER — Ambulatory Visit: Admitting: Sports Medicine

## 2024-07-07 ENCOUNTER — Ambulatory Visit: Admitting: Physician Assistant

## 2024-07-10 ENCOUNTER — Other Ambulatory Visit (HOSPITAL_BASED_OUTPATIENT_CLINIC_OR_DEPARTMENT_OTHER): Payer: Self-pay

## 2024-07-10 ENCOUNTER — Other Ambulatory Visit: Payer: Self-pay | Admitting: Physician Assistant

## 2024-07-10 MED ORDER — AMPHETAMINE-DEXTROAMPHETAMINE 10 MG PO TABS
5.0000 mg | ORAL_TABLET | Freq: Every day | ORAL | 0 refills | Status: DC
  Filled 2024-07-10: qty 60, 30d supply, fill #0

## 2024-07-10 MED ORDER — LISDEXAMFETAMINE DIMESYLATE 70 MG PO CAPS
70.0000 mg | ORAL_CAPSULE | Freq: Every day | ORAL | 0 refills | Status: DC
  Filled 2024-07-10: qty 30, 30d supply, fill #0

## 2024-07-10 NOTE — Telephone Encounter (Signed)
 06/06/2024 LOV  06/06/2024 fill date  30/0 refills

## 2024-07-18 NOTE — Progress Notes (Unsigned)
    Crystal Wiley Crystal Wiley [address] [place] [address] Phone: [phone]   Assessment and Plan:     There are no diagnoses linked to this encounter.  ***   Pertinent previous records reviewed include ***    Follow Up: ***     Subjective:   I, Crystal Wiley, am serving as a Neurosurgeon for Doctor Crystal Wiley  Chief Complaint: L arm pain   HPI:  07/19/2024 Patient is a 48 year old female with L arm pain. Patient states  Relevant Historical Information: ***  Additional pertinent review of systems negative.   Current Outpatient Medications:    amphetamine -dextroamphetamine  (ADDERALL) 10 MG tablet, Take 0.5-2 tablets (5-20 mg total) by mouth daily with lunch as needed, Disp: 60 tablet, Rfl: 0   buPROPion  (WELLBUTRIN  SR) 200 MG 12 hr tablet, Take 1 tablet (200 mg total) by mouth 2 (two) times daily., Disp: 180 tablet, Rfl: 1   cholecalciferol (VITAMIN D3) 25 MCG (1000 UNIT) tablet, Take 1,000 Units by mouth daily., Disp: , Rfl:    escitalopram  (LEXAPRO ) 10 MG tablet, Take 1 tablet (10 mg total) by mouth daily., Disp: 90 tablet, Rfl: 3   levothyroxine  (SYNTHROID ) 112 MCG tablet, Take 1 tablet (112 mcg total) by mouth daily before breakfast., Disp: 90 tablet, Rfl: 1   lisdexamfetamine (VYVANSE ) 70 MG capsule, Take 1 capsule (70 mg total) by mouth daily., Disp: 30 capsule, Rfl: 0   Magnesium Glycinate 100 MG CAPS, Take by mouth., Disp: , Rfl:    omeprazole (PRILOSEC) 20 MG capsule, Take 20 mg by mouth daily., Disp: , Rfl:    ondansetron  (ZOFRAN ) 4 MG tablet, Take 1 tablet (4 mg total) by mouth every 8 (eight) hours as needed for nausea or vomiting., Disp: 20 tablet, Rfl: 0   Objective:     There were no vitals filed for this visit.    There is no height or weight on file to calculate BMI.    Physical Exam:    ***   Electronically signed by:  Crystal Wiley D.Crystal Wiley Crystal Wiley 7:49 AM 07/18/24

## 2024-07-19 ENCOUNTER — Ambulatory Visit: Admitting: Sports Medicine

## 2024-07-19 ENCOUNTER — Other Ambulatory Visit (HOSPITAL_BASED_OUTPATIENT_CLINIC_OR_DEPARTMENT_OTHER): Payer: Self-pay

## 2024-07-19 VITALS — BP 118/80 | HR 67 | Ht 63.0 in | Wt 170.0 lb

## 2024-07-19 DIAGNOSIS — M7552 Bursitis of left shoulder: Secondary | ICD-10-CM

## 2024-07-19 DIAGNOSIS — G8929 Other chronic pain: Secondary | ICD-10-CM

## 2024-07-19 DIAGNOSIS — M25512 Pain in left shoulder: Secondary | ICD-10-CM

## 2024-07-19 MED ORDER — MELOXICAM 15 MG PO TABS
15.0000 mg | ORAL_TABLET | Freq: Every day | ORAL | 0 refills | Status: DC
  Filled 2024-07-19: qty 30, 30d supply, fill #0

## 2024-07-19 MED ORDER — MELOXICAM 15 MG PO TABS
ORAL_TABLET | ORAL | 0 refills | Status: DC
  Filled 2024-07-19 – 2024-08-01 (×2): qty 30, 30d supply, fill #0

## 2024-07-19 NOTE — Patient Instructions (Signed)
`-   Start meloxicam 15 mg daily x2 weeks.  If still having pain after 2 weeks, complete 3rd-week of NSAID. May use remaining NSAID as needed once daily for pain control.  Do not to use additional over-the-counter NSAIDs (ibuprofen, naproxen, Advil, Aleve, etc.) while taking prescription NSAIDs.  May use Tylenol [phone] mg 2 to 3 times a day for breakthrough pain. Shoulder HEP  4 week follow up

## 2024-07-29 ENCOUNTER — Other Ambulatory Visit (HOSPITAL_BASED_OUTPATIENT_CLINIC_OR_DEPARTMENT_OTHER): Payer: Self-pay

## 2024-08-01 ENCOUNTER — Other Ambulatory Visit (HOSPITAL_BASED_OUTPATIENT_CLINIC_OR_DEPARTMENT_OTHER): Payer: Self-pay

## 2024-08-08 ENCOUNTER — Other Ambulatory Visit: Payer: Self-pay | Admitting: Physician Assistant

## 2024-08-08 ENCOUNTER — Other Ambulatory Visit (HOSPITAL_BASED_OUTPATIENT_CLINIC_OR_DEPARTMENT_OTHER): Payer: Self-pay

## 2024-08-08 MED ORDER — LISDEXAMFETAMINE DIMESYLATE 70 MG PO CAPS
70.0000 mg | ORAL_CAPSULE | Freq: Every day | ORAL | 0 refills | Status: DC
  Filled 2024-08-08: qty 30, 30d supply, fill #0

## 2024-08-08 MED ORDER — AMPHETAMINE-DEXTROAMPHETAMINE 10 MG PO TABS
5.0000 mg | ORAL_TABLET | Freq: Every day | ORAL | 0 refills | Status: DC
  Filled 2024-08-08: qty 60, 30d supply, fill #0

## 2024-08-08 NOTE — Telephone Encounter (Signed)
 Pt requesting refills for Vyvanse  70 mg, Adderall 10 mg. Last OV 05/2024.

## 2024-08-10 ENCOUNTER — Other Ambulatory Visit (HOSPITAL_BASED_OUTPATIENT_CLINIC_OR_DEPARTMENT_OTHER): Payer: Self-pay

## 2024-08-15 NOTE — Progress Notes (Unsigned)
 Crystal Wiley [facility] [address] [place] [address] Phone: [phone]   Assessment and Plan:     1. Chronic left shoulder pain (Primary) 2. Subacromial bursitis of left shoulder joint -Chronic with exacerbation, subsequent visit - Still consistent with rotator cuff pathology versus subacromial bursitis from physical activity, side sleeping.  Mild, though incomplete improvement with HEP and meloxicam  course - Use meloxicam  15 mg daily as needed for breakthrough pain.  Recommend limiting chronic NSAIDs to 1-2 doses per week to prevent long-term side effects. Use Tylenol  500 to 1000 mg tablets 2-3 times a day as needed for day-to-day pain relief.    -Patient elected for subacromial CSI.  Tolerated well per note below  Procedure: Subacromial Injection Side: Left  Risks explained and consent was given verbally. The site was cleaned with alcohol prep. A steroid injection was performed from posterior approach using 2mL of 1% lidocaine  without epinephrine and 1mL of kenalog 40mg /ml. This was well tolerated.  Needle was removed, hemostasis achieved, and post injection instructions were explained.   Pt was advised to call or return to clinic if these symptoms worsen or fail to improve as anticipated.   Pertinent previous records reviewed include none   Follow Up: 3 to 4 weeks for reevaluation.  If no improvement or worsening of symptoms, could consider physical therapy versus x-ray versus ultrasound   Subjective:   I, Crystal Wiley, am serving as a Neurosurgeon for Doctor Crystal Wiley   Chief Complaint: L arm pain    HPI:  07/19/2024 Patient is a 48 year old female with L arm pain. Patient states pain for a couple of years. Pain is increases of the past couple of weeks. Decreased ROM. Tylenol  and ibu for the pain do not help. No numbness or tingling. Pain radiates to the neck and back. No decrease in grip strength. Isnt able to lift    08/16/2024 Patient states she is a little better   Relevant Historical Information: Hypothyroidism, prediabetes  Additional pertinent review of systems negative.   Current Outpatient Medications:    amphetamine -dextroamphetamine  (ADDERALL) 10 MG tablet, Take 0.5-2 tablets (5-20 mg total) by mouth daily with lunch as needed, Disp: 60 tablet, Rfl: 0   buPROPion  (WELLBUTRIN  SR) 200 MG 12 hr tablet, Take 1 tablet (200 mg total) by mouth 2 (two) times daily., Disp: 180 tablet, Rfl: 1   cholecalciferol (VITAMIN D3) 25 MCG (1000 UNIT) tablet, Take 1,000 Units by mouth daily., Disp: , Rfl:    escitalopram  (LEXAPRO ) 10 MG tablet, Take 1 tablet (10 mg total) by mouth daily., Disp: 90 tablet, Rfl: 3   levothyroxine  (SYNTHROID ) 112 MCG tablet, Take 1 tablet (112 mcg total) by mouth daily before breakfast., Disp: 90 tablet, Rfl: 1   lisdexamfetamine (VYVANSE ) 70 MG capsule, Take 1 capsule (70 mg total) by mouth daily., Disp: 30 capsule, Rfl: 0   Magnesium Glycinate 100 MG CAPS, Take by mouth., Disp: , Rfl:    meloxicam  (MOBIC ) 15 MG tablet, Take 1 tablet daily for 2 weeks.  If still in pain after 2 weeks, take 1 tablet daily for an additional 1 week., Disp: 30 tablet, Rfl: 0   omeprazole (PRILOSEC) 20 MG capsule, Take 20 mg by mouth daily., Disp: , Rfl:    ondansetron  (ZOFRAN ) 4 MG tablet, Take 1 tablet (4 mg total) by mouth every 8 (eight) hours as needed for nausea or vomiting., Disp: 20 tablet, Rfl: 0   Objective:  Vitals:   08/16/24 0823  BP: 130/72  Pulse: 88  SpO2: 100%  Weight: 176 lb (79.8 kg)  Height: 5' 3 (1.6 m)      Body mass index is 31.18 kg/m.    Physical Exam:    Gen: Appears well, nad, nontoxic and pleasant Neuro:sensation intact, strength is 5/5 with df/pf/inv/ev, muscle tone wnl Skin: no suspicious lesion or defmority Psych: A&O, appropriate mood and affect   Left shoulder:  No deformity, swelling or muscle wasting No scapular winging FF 180, abd 180 with  painful arc from 90 through 180, int 0, ext 90 NTTP over the [place], clavicle, ac, coracoid, biceps groove, humerus, deltoid, trapezius, cervical spine Positive neer, hawkins, subscap lift off Negative empty can, obriens, crossarm,   speeds Neg ant drawer, sulcus sign, apprehension Negative Spurling's test bilat FROM of neck       Electronically signed by:  Crystal Wiley [facility] 8:34 AM 08/16/24

## 2024-08-16 ENCOUNTER — Ambulatory Visit: Admitting: Sports Medicine

## 2024-08-16 VITALS — BP 130/72 | HR 88 | Ht 63.0 in | Wt 176.0 lb

## 2024-08-16 DIAGNOSIS — M25512 Pain in left shoulder: Secondary | ICD-10-CM

## 2024-08-16 DIAGNOSIS — G8929 Other chronic pain: Secondary | ICD-10-CM

## 2024-08-16 DIAGNOSIS — M7552 Bursitis of left shoulder: Secondary | ICD-10-CM

## 2024-08-16 NOTE — Patient Instructions (Signed)
 Continue HEP   Call if you would like PT referral   3-4 week follow up

## 2024-09-06 NOTE — Progress Notes (Unsigned)
 Crystal [facility] [address] [place] [address] Phone: [phone]   Assessment and Plan:     1. Chronic left shoulder pain (Primary) 2. Subacromial bursitis of left shoulder joint 3. Strain of cervical portion of left trapezius muscle -Chronic with exacerbation, subsequent visit - Overall moderate improvement, 50%, after subacromial CSI at previous office visit on 08/16/24.  Still most consistent with rotator cuff pathology versus subacromial bursitis - Will obtain x-ray at today's visit and further review at follow-up visit - Use meloxicam  15 mg daily as needed for breakthrough pain.  Recommend limiting chronic NSAIDs to 1-2 doses per week to prevent long-term side effects. Use Tylenol  500 to 1000 mg tablets 2-3 times a day as needed for day-to-day pain relief.    - Start prednisone Dosepak - Start Flexeril 5 to 10 mg nightly as needed for muscle spasms - Increase frequency of shoulder and rotator cuff HEP start HEP for trapezius.  Offered physical therapy which patient declined at today's visit, but could be offered at future visit   15 additional minutes spent for educating Therapeutic Home Exercise Program.  This included exercises focusing on stretching, strengthening, with focus on eccentric aspects.   Long term goals include an improvement in range of motion, strength, endurance as well as avoiding reinjury. Patient's frequency would include in 1-2 times a day, 3-5 times a week for a duration of 6-12 weeks. Proper technique shown and discussed handout in great detail with ATC.  All questions were discussed and answered.    Pertinent previous records reviewed include none   Follow Up: 4 to 6 weeks for reevaluation.  Patient could call for physical therapy referral to open physical therapy at any time.  If no improvement at follow-up visit, could consider ultrasound versus MRI   Subjective:   I, Crystal Crystal Wiley, am serving as a  Neurosurgeon for Doctor Crystal Crystal Wiley   Chief Complaint: L arm pain    HPI:  07/19/2024 Patient is a 48 year old female with L arm pain. Patient states pain for a couple of years. Pain is increases of the past couple of weeks. Decreased ROM. Tylenol  and ibu for the pain do not help. No numbness or tingling. Pain radiates to the neck and back. No decrease in grip strength. Isnt able to lift    08/16/2024 Patient states she is a little better   09/07/2024 Patient states CSI helped for 2 weeks. Pain came back last week. Pain has radiated up to the neck    Relevant Historical Information: Hypothyroidism, prediabetes  Additional pertinent review of systems negative.   Current Outpatient Medications:    amphetamine -dextroamphetamine  (ADDERALL) 10 MG tablet, Take 0.5-2 tablets (5-20 mg total) by mouth daily with lunch as needed, Disp: 60 tablet, Rfl: 0   buPROPion  (WELLBUTRIN  SR) 200 MG 12 hr tablet, Take 1 tablet (200 mg total) by mouth 2 (two) times daily., Disp: 180 tablet, Rfl: 1   cholecalciferol (VITAMIN D3) 25 MCG (1000 UNIT) tablet, Take 1,000 Units by mouth daily., Disp: , Rfl:    escitalopram  (LEXAPRO ) 10 MG tablet, Take 1 tablet (10 mg total) by mouth daily., Disp: 90 tablet, Rfl: 3   levothyroxine  (SYNTHROID ) 112 MCG tablet, Take 1 tablet (112 mcg total) by mouth daily before breakfast., Disp: 90 tablet, Rfl: 1   lisdexamfetamine (VYVANSE ) 70 MG capsule, Take 1 capsule (70 mg total) by mouth daily., Disp: 30 capsule, Rfl: 0   Magnesium Glycinate 100 MG CAPS,  Take by mouth., Disp: , Rfl:    meloxicam  (MOBIC ) 15 MG tablet, Take 1 tablet daily for 2 weeks.  If still in pain after 2 weeks, take 1 tablet daily for an additional 1 week., Disp: 30 tablet, Rfl: 0   omeprazole (PRILOSEC) 20 MG capsule, Take 20 mg by mouth daily., Disp: , Rfl:    ondansetron  (ZOFRAN ) 4 MG tablet, Take 1 tablet (4 mg total) by mouth every 8 (eight) hours as needed for nausea or vomiting., Disp: 20 tablet, Rfl: 0    Objective:     Vitals:   09/07/24 0815  BP: 120/86  Pulse: 78  SpO2: 100%  Weight: 180 lb (81.6 kg)  Height: 5' 3 (1.6 m)      Body mass index is 31.89 kg/m.    Physical Exam:    Gen: Appears well, nad, nontoxic and pleasant Neuro:sensation intact, strength is 5/5 with df/pf/inv/ev, muscle tone wnl Skin: no suspicious lesion or defmority Psych: A&O, appropriate mood and affect   Left shoulder:  No deformity, swelling or muscle wasting No scapular winging FF 180, abd 180 with painful arc from 90 through 180, int 0, ext 90 NTTP over the [place], clavicle, ac, coracoid, biceps groove, humerus, deltoid, trapezius, cervical spine Positive neer, hawkins, subscap lift off Negative empty can, obriens, crossarm,   speeds Neg ant drawer, sulcus sign, apprehension Negative Spurling's test bilat FROM of neck       Electronically signed by:  Crystal Crystal Wiley Crystal [facility] 8:34 AM 09/07/24

## 2024-09-07 ENCOUNTER — Ambulatory Visit (INDEPENDENT_AMBULATORY_CARE_PROVIDER_SITE_OTHER)

## 2024-09-07 ENCOUNTER — Other Ambulatory Visit (HOSPITAL_BASED_OUTPATIENT_CLINIC_OR_DEPARTMENT_OTHER): Payer: Self-pay

## 2024-09-07 ENCOUNTER — Other Ambulatory Visit: Payer: Self-pay | Admitting: Physician Assistant

## 2024-09-07 ENCOUNTER — Ambulatory Visit: Admitting: Sports Medicine

## 2024-09-07 VITALS — BP 120/86 | HR 78 | Ht 63.0 in | Wt 180.0 lb

## 2024-09-07 DIAGNOSIS — G8929 Other chronic pain: Secondary | ICD-10-CM

## 2024-09-07 DIAGNOSIS — M25512 Pain in left shoulder: Secondary | ICD-10-CM

## 2024-09-07 DIAGNOSIS — M7552 Bursitis of left shoulder: Secondary | ICD-10-CM

## 2024-09-07 DIAGNOSIS — S161XXA Strain of muscle, fascia and tendon at neck level, initial encounter: Secondary | ICD-10-CM

## 2024-09-07 MED ORDER — LISDEXAMFETAMINE DIMESYLATE 70 MG PO CAPS
70.0000 mg | ORAL_CAPSULE | Freq: Every day | ORAL | 0 refills | Status: DC
  Filled 2024-09-07: qty 30, 30d supply, fill #0

## 2024-09-07 MED ORDER — METHYLPREDNISOLONE 4 MG PO TBPK
ORAL_TABLET | ORAL | 0 refills | Status: DC
  Filled 2024-09-07: qty 21, 6d supply, fill #0

## 2024-09-07 MED ORDER — AMPHETAMINE-DEXTROAMPHETAMINE 10 MG PO TABS
5.0000 mg | ORAL_TABLET | Freq: Every day | ORAL | 0 refills | Status: DC
  Filled 2024-09-07: qty 60, 30d supply, fill #0

## 2024-09-07 MED ORDER — CYCLOBENZAPRINE HCL 5 MG PO TABS
5.0000 mg | ORAL_TABLET | Freq: Every day | ORAL | 0 refills | Status: DC
  Filled 2024-09-07: qty 30, 30d supply, fill #0

## 2024-09-07 NOTE — Telephone Encounter (Signed)
 Pt requesting refill for Adderall 10 mg and Vyvanse  70 mg. Last OV 06/06/2024.

## 2024-09-07 NOTE — Patient Instructions (Signed)
 Prednisone dos pak   Flexeril 5-10 mg nightly as needed for muscle spasm  Continue HEP   Call if you would like  PT referral   X-rays on the way out   4-6 week follow up

## 2024-09-08 ENCOUNTER — Other Ambulatory Visit: Payer: Self-pay

## 2024-09-11 ENCOUNTER — Ambulatory Visit: Payer: Self-pay | Admitting: Sports Medicine

## 2024-10-10 NOTE — Progress Notes (Deleted)
    [facility] [address] [place] [address] Phone: [phone]   Assessment and Plan:     ***    Pertinent previous records reviewed include ***   Follow Up: ***     Subjective:   I, Crystal Wiley, am serving as a neurosurgeon for Doctor Crystal Wiley   Chief Complaint: L arm pain    HPI:  07/19/2024 Patient is a 48 year old female with L arm pain. Patient states pain for a couple of years. Pain is increases of the past couple of weeks. Decreased ROM. Tylenol  and ibu for the pain do not help. No numbness or tingling. Pain radiates to the neck and back. No decrease in grip strength. Isnt able to lift    08/16/2024 Patient states she is a little better    09/07/2024 Patient states CSI helped for 2 weeks. Pain came back last week. Pain has radiated up to the neck   10/12/2024 Patient states   Relevant Historical Information: Hypothyroidism, prediabetes  Additional pertinent review of systems negative.   Current Outpatient Medications:    amphetamine -dextroamphetamine  (ADDERALL) 10 MG tablet, Take 0.5-2 tablets (5-20 mg total) by mouth daily with lunch as needed, Disp: 60 tablet, Rfl: 0   buPROPion  (WELLBUTRIN  SR) 200 MG 12 hr tablet, Take 1 tablet (200 mg total) by mouth 2 (two) times daily., Disp: 180 tablet, Rfl: 1   cholecalciferol (VITAMIN D3) 25 MCG (1000 UNIT) tablet, Take 1,000 Units by mouth daily., Disp: , Rfl:    cyclobenzaprine (FLEXERIL) 5 MG tablet, Take 1 tablet (5 mg total) by mouth at bedtime., Disp: 30 tablet, Rfl: 0   escitalopram  (LEXAPRO ) 10 MG tablet, Take 1 tablet (10 mg total) by mouth daily., Disp: 90 tablet, Rfl: 3   levothyroxine  (SYNTHROID ) 112 MCG tablet, Take 1 tablet (112 mcg total) by mouth daily before breakfast., Disp: 90 tablet, Rfl: 1   lisdexamfetamine (VYVANSE ) 70 MG capsule, Take 1 capsule (70 mg total) by mouth daily., Disp: 30 capsule, Rfl: 0   Magnesium Glycinate 100 MG CAPS,  Take by mouth., Disp: , Rfl:    meloxicam  (MOBIC ) 15 MG tablet, Take 1 tablet daily for 2 weeks.  If still in pain after 2 weeks, take 1 tablet daily for an additional 1 week., Disp: 30 tablet, Rfl: 0   methylPREDNISolone (MEDROL DOSEPAK) 4 MG TBPK tablet, Take 6 tablets on day 1.  Take 5 tablets on day 2.  Take 4 tablets on day 3.  Take 3 tablets on day 4.  Take 2 tablets on day 5.  Take 1 tablet on day 6., Disp: 21 tablet, Rfl: 0   omeprazole (PRILOSEC) 20 MG capsule, Take 20 mg by mouth daily., Disp: , Rfl:    ondansetron  (ZOFRAN ) 4 MG tablet, Take 1 tablet (4 mg total) by mouth every 8 (eight) hours as needed for nausea or vomiting., Disp: 20 tablet, Rfl: 0   Objective:     There were no vitals filed for this visit.    There is no height or weight on file to calculate BMI.    Physical Exam:    ***   Electronically signed by:  Crystal Wiley D.[name] [facility] 3:55 PM 10/10/24

## 2024-10-12 ENCOUNTER — Ambulatory Visit: Admitting: Sports Medicine

## 2024-10-16 NOTE — Progress Notes (Signed)
 Ben Other Atienza D.[name] [name] [address] [place] [address] Phone: [phone]   Assessment and Plan:     1. Chronic left shoulder pain (Primary) 2. Subacromial bursitis of left shoulder joint 3. Strain of cervical portion of left trapezius muscle -Chronic with exacerbation, subsequent visit - Overall no change in symptoms when compared with previous office visit.  Continued left shoulder pain still most consistent with rotator cuff pathology versus subacromial bursitis.  No improvement with prednisone Dosepak, relative rest, Flexeril  as needed, HEP.  Mild improvement after subacromial CSI on 9/24's 25 - Use meloxicam  15 mg daily as needed for breakthrough pain.  Recommend limiting chronic NSAIDs to 1-2 doses per week to prevent long-term side effects. Use Tylenol  500 to 1000 mg tablets 2-3 times a day as needed for day-to-day pain relief.    - Use Voltaren gel topically over areas of pain -Continue HEP and perform HEP on both shoulders - Recommend further evaluation with left shoulder MRI due to no significant improvement despite >6 weeks of conservative therapy, pain with day-to-day activities, pain >6/10. - Reviewed patient's x-ray.  My interpretation: No acute fracture or dislocation.  Pertinent previous records reviewed include left shoulder x-ray   Follow Up: 1 week after MRI to review results and discuss treatment plan   Subjective:   I, Claretha Schimke am a scribe for Dr. Leonce.     Chief Complaint: L arm pain    HPI:  07/19/2024 Patient is a 48 year old female with L arm pain. Patient states pain for a couple of years. Pain is increases of the past couple of weeks. Decreased ROM. Tylenol  and ibu for the pain do not help. No numbness or tingling. Pain radiates to the neck and back. No decrease in grip strength. Isnt able to lift    08/16/2024 Patient states she is a little better    09/07/2024 Patient states CSI helped for 2  weeks. Pain came back last week. Pain has radiated up to the neck   10/23/2024 Patient states that she is about the same as last visit.    Relevant Historical Information: Hypothyroidism, prediabetes  Additional pertinent review of systems negative.   Current Outpatient Medications:    amphetamine -dextroamphetamine  (ADDERALL) 10 MG tablet, Take 0.5-2 tablets (5-20 mg total) by mouth daily with lunch as needed, Disp: 60 tablet, Rfl: 0   buPROPion  (WELLBUTRIN  SR) 200 MG 12 hr tablet, Take 1 tablet (200 mg total) by mouth 2 (two) times daily., Disp: 180 tablet, Rfl: 1   cholecalciferol (VITAMIN D3) 25 MCG (1000 UNIT) tablet, Take 1,000 Units by mouth daily., Disp: , Rfl:    cyclobenzaprine  (FLEXERIL ) 5 MG tablet, Take 1 tablet (5 mg total) by mouth at bedtime., Disp: 30 tablet, Rfl: 0   escitalopram  (LEXAPRO ) 10 MG tablet, Take 1 tablet (10 mg total) by mouth daily., Disp: 90 tablet, Rfl: 3   levothyroxine  (SYNTHROID ) 112 MCG tablet, Take 1 tablet (112 mcg total) by mouth daily before breakfast., Disp: 90 tablet, Rfl: 1   lisdexamfetamine (VYVANSE ) 70 MG capsule, Take 1 capsule (70 mg total) by mouth daily., Disp: 30 capsule, Rfl: 0   Magnesium Glycinate 100 MG CAPS, Take by mouth., Disp: , Rfl:    meloxicam  (MOBIC ) 15 MG tablet, Take 1 tablet daily for 2 weeks.  If still in pain after 2 weeks, take 1 tablet daily for an additional 1 week., Disp: 30 tablet, Rfl: 0   methylPREDNISolone  (MEDROL  DOSEPAK) 4 MG  TBPK tablet, Take 6 tablets on day 1.  Take 5 tablets on day 2.  Take 4 tablets on day 3.  Take 3 tablets on day 4.  Take 2 tablets on day 5.  Take 1 tablet on day 6., Disp: 21 tablet, Rfl: 0   omeprazole (PRILOSEC) 20 MG capsule, Take 20 mg by mouth daily., Disp: , Rfl:    ondansetron  (ZOFRAN ) 4 MG tablet, Take 1 tablet (4 mg total) by mouth every 8 (eight) hours as needed for nausea or vomiting., Disp: 20 tablet, Rfl: 0   Objective:     Vitals:   10/23/24 1247  BP: 124/70  Pulse: 83   SpO2: 98%  Weight: 184 lb 3.2 oz (83.6 kg)  Height: 5' 3 (1.6 m)      Body mass index is 32.63 kg/m.    Physical Exam:    Gen: Appears well, nad, nontoxic and pleasant Neuro:sensation intact, strength is 5/5 with df/pf/inv/ev, muscle tone wnl Skin: no suspicious lesion or defmority Psych: A&O, appropriate mood and affect   Left shoulder:  No deformity, swelling or muscle wasting No scapular winging FF 180, abd 180 with painful arc from 90 through 180, int 0, ext 90 NTTP over the [place], clavicle, ac, coracoid, biceps groove, humerus, deltoid, trapezius, cervical spine Positive neer, hawkins, subscap lift off Negative empty can, obriens, crossarm,   speeds Neg ant drawer, sulcus sign, apprehension Negative Spurling's test bilat FROM of neck     Electronically signed by:  Odis Mace D.[name] [name] 12:57 PM 10/23/24

## 2024-10-17 ENCOUNTER — Other Ambulatory Visit: Payer: Self-pay | Admitting: Physician Assistant

## 2024-10-18 NOTE — Telephone Encounter (Signed)
 Pt requesting refill for Adderall 10 mg tablet and Vyvanse  70 mg. Last OV 06/06/2024.

## 2024-10-21 ENCOUNTER — Other Ambulatory Visit: Payer: Self-pay | Admitting: Physician Assistant

## 2024-10-21 ENCOUNTER — Other Ambulatory Visit (HOSPITAL_BASED_OUTPATIENT_CLINIC_OR_DEPARTMENT_OTHER): Payer: Self-pay

## 2024-10-23 ENCOUNTER — Ambulatory Visit: Admitting: Sports Medicine

## 2024-10-23 VITALS — BP 124/70 | HR 83 | Ht 63.0 in | Wt 184.2 lb

## 2024-10-23 DIAGNOSIS — M25512 Pain in left shoulder: Secondary | ICD-10-CM

## 2024-10-23 DIAGNOSIS — M7552 Bursitis of left shoulder: Secondary | ICD-10-CM | POA: Diagnosis not present

## 2024-10-23 DIAGNOSIS — S161XXA Strain of muscle, fascia and tendon at neck level, initial encounter: Secondary | ICD-10-CM | POA: Diagnosis not present

## 2024-10-23 DIAGNOSIS — G8929 Other chronic pain: Secondary | ICD-10-CM

## 2024-10-23 NOTE — Patient Instructions (Signed)
 MRI of left shoulder without contrast to [facility] Tylenol  1-2 tabs po q4h prn Voltaren gel over areas of pain. Continue Hep on both shoulders Follow up 1 week after MR.

## 2024-10-23 NOTE — Telephone Encounter (Signed)
 Refused refills. Pt scheduled for 10/25/2024.

## 2024-10-25 ENCOUNTER — Encounter: Payer: Self-pay | Admitting: Physician Assistant

## 2024-10-25 ENCOUNTER — Ambulatory Visit: Admitting: Physician Assistant

## 2024-10-25 ENCOUNTER — Other Ambulatory Visit (HOSPITAL_BASED_OUTPATIENT_CLINIC_OR_DEPARTMENT_OTHER): Payer: Self-pay

## 2024-10-25 VITALS — BP 140/96 | HR 92 | Temp 98.0°F | Ht 63.0 in | Wt 185.4 lb

## 2024-10-25 DIAGNOSIS — F411 Generalized anxiety disorder: Secondary | ICD-10-CM

## 2024-10-25 DIAGNOSIS — E559 Vitamin D deficiency, unspecified: Secondary | ICD-10-CM

## 2024-10-25 DIAGNOSIS — F909 Attention-deficit hyperactivity disorder, unspecified type: Secondary | ICD-10-CM

## 2024-10-25 DIAGNOSIS — E039 Hypothyroidism, unspecified: Secondary | ICD-10-CM | POA: Diagnosis not present

## 2024-10-25 DIAGNOSIS — R21 Rash and other nonspecific skin eruption: Secondary | ICD-10-CM | POA: Diagnosis not present

## 2024-10-25 DIAGNOSIS — R030 Elevated blood-pressure reading, without diagnosis of hypertension: Secondary | ICD-10-CM

## 2024-10-25 DIAGNOSIS — R110 Nausea: Secondary | ICD-10-CM

## 2024-10-25 DIAGNOSIS — R5383 Other fatigue: Secondary | ICD-10-CM | POA: Diagnosis not present

## 2024-10-25 DIAGNOSIS — Z8632 Personal history of gestational diabetes: Secondary | ICD-10-CM | POA: Diagnosis not present

## 2024-10-25 LAB — CBC WITH DIFFERENTIAL/PLATELET
Basophils Absolute: 0 K/uL (ref 0.0–0.1)
Basophils Relative: 0.5 % (ref 0.0–3.0)
Eosinophils Absolute: 0.1 K/uL (ref 0.0–0.7)
Eosinophils Relative: 2.1 % (ref 0.0–5.0)
HCT: 38 % (ref 36.0–46.0)
Hemoglobin: 12.7 g/dL (ref 12.0–15.0)
Lymphocytes Relative: 31.5 % (ref 12.0–46.0)
Lymphs Abs: 2.1 K/uL (ref 0.7–4.0)
MCHC: 33.5 g/dL (ref 30.0–36.0)
MCV: 88 fl (ref 78.0–100.0)
Monocytes Absolute: 0.4 K/uL (ref 0.1–1.0)
Monocytes Relative: 5.4 % (ref 3.0–12.0)
Neutro Abs: 4 K/uL (ref 1.4–7.7)
Neutrophils Relative %: 60.5 % (ref 43.0–77.0)
Platelets: 277 K/uL (ref 150.0–400.0)
RBC: 4.32 Mil/uL (ref 3.87–5.11)
RDW: 12.6 % (ref 11.5–15.5)
WBC: 6.7 K/uL (ref 4.0–10.5)

## 2024-10-25 LAB — TSH: TSH: 1.42 u[IU]/mL (ref 0.35–5.50)

## 2024-10-25 LAB — CORTISOL: Cortisol, Plasma: 7.3 ug/dL

## 2024-10-25 LAB — IBC + FERRITIN
Ferritin: 31.3 ng/mL (ref 10.0–291.0)
Iron: 96 ug/dL (ref 42–145)
Saturation Ratios: 27.5 % (ref 20.0–50.0)
TIBC: 348.6 ug/dL (ref 250.0–450.0)
Transferrin: 249 mg/dL (ref 212.0–360.0)

## 2024-10-25 LAB — COMPREHENSIVE METABOLIC PANEL WITH GFR
ALT: 18 U/L (ref 0–35)
AST: 17 U/L (ref 0–37)
Albumin: 4.2 g/dL (ref 3.5–5.2)
Alkaline Phosphatase: 94 U/L (ref 39–117)
BUN: 6 mg/dL (ref 6–23)
CO2: 28 meq/L (ref 19–32)
Calcium: 9.1 mg/dL (ref 8.4–10.5)
Chloride: 104 meq/L (ref 96–112)
Creatinine, Ser: 0.7 mg/dL (ref 0.40–1.20)
GFR: 103.48 mL/min (ref >60.00)
Glucose, Bld: 62 mg/dL — ABNORMAL LOW (ref 70–99)
Potassium: 3.7 meq/L (ref 3.5–5.1)
Sodium: 140 meq/L (ref 135–145)
Total Bilirubin: 0.3 mg/dL (ref 0.2–1.2)
Total Protein: 6.8 g/dL (ref 6.0–8.3)

## 2024-10-25 LAB — VITAMIN B12: Vitamin B-12: 842 pg/mL (ref 211–911)

## 2024-10-25 LAB — C-REACTIVE PROTEIN: CRP: <0.5 mg/dL (ref 0.5–20.0)

## 2024-10-25 LAB — SEDIMENTATION RATE: Sed Rate: 6 mm/h (ref 0–20)

## 2024-10-25 LAB — HEMOGLOBIN A1C: Hgb A1c MFr Bld: 5.1 % (ref 4.6–6.5)

## 2024-10-25 LAB — VITAMIN D 25 HYDROXY (VIT D DEFICIENCY, FRACTURES): VITD: 26.07 ng/mL — ABNORMAL LOW (ref 30.00–100.00)

## 2024-10-25 MED ORDER — ONDANSETRON HCL 4 MG PO TABS
4.0000 mg | ORAL_TABLET | Freq: Three times a day (TID) | ORAL | 1 refills | Status: AC | PRN
  Filled 2024-10-25: qty 9, 3d supply, fill #0

## 2024-10-25 MED ORDER — LISDEXAMFETAMINE DIMESYLATE 70 MG PO CAPS
70.0000 mg | ORAL_CAPSULE | Freq: Every day | ORAL | 0 refills | Status: AC
  Filled 2024-12-23: qty 30, 30d supply, fill #0

## 2024-10-25 MED ORDER — AMPHETAMINE-DEXTROAMPHETAMINE 10 MG PO TABS
5.0000 mg | ORAL_TABLET | Freq: Every day | ORAL | 0 refills | Status: DC
  Filled 2024-10-25: qty 60, 30d supply, fill #0

## 2024-10-25 MED ORDER — OMEPRAZOLE 20 MG PO CPDR
20.0000 mg | DELAYED_RELEASE_CAPSULE | Freq: Every day | ORAL | 1 refills | Status: AC
  Filled 2024-10-25: qty 90, 90d supply, fill #0

## 2024-10-25 MED ORDER — LISDEXAMFETAMINE DIMESYLATE 70 MG PO CAPS
70.0000 mg | ORAL_CAPSULE | Freq: Every day | ORAL | 0 refills | Status: AC
  Filled 2024-10-25: qty 30, 30d supply, fill #0

## 2024-10-25 MED ORDER — LISDEXAMFETAMINE DIMESYLATE 70 MG PO CAPS
70.0000 mg | ORAL_CAPSULE | Freq: Every day | ORAL | 0 refills | Status: AC
  Filled 2024-11-24: qty 30, 30d supply, fill #0

## 2024-10-25 NOTE — Progress Notes (Signed)
 History of Present Illness:   Chief Complaint  Patient presents with   Medication Refill    Discussed the use of AI scribe software for clinical note transcription with the patient, who gave verbal consent to proceed.  History of Present Illness   Crystal Wiley is a 48 year old female with thyroid  disease and elevated blood pressure reading who presents with persistent nausea and skin rashes.  She has persistent nausea that significantly interferes with daily activities. She uses Zofran  with relief. She takes Wellbutrin  twice daily for seven years without prior nausea, and also takes Vyvanse , Adderall, Lexapro , magnesium, vitamin D , and intermittent Prilosec, all with food.  She has a pruritic, hive-like red rash on her neck and arms for over a year. She has not changed detergents or medications around onset. She has photos of the rash and previously improved with a steroid dose pack.  She has Hashimoto's thyroiditis and takes her 112 mcg levothyroxine  at night. Her TSH was low at 0.3 in May. She reports fatigue, shoulder blade pain, and leg stiffness, especially on waking.  She has diarrhea after juice or ice cream and uses lactose-free milk. A knot-like abdominal pain improved with a two-week course of Prilosec.  She has numbness and tingling in her hand while sleeping, which she attributes to her sleeping position. She denies other neurologic symptoms.       Past Medical History:  Diagnosis Date   Anemia    Anxiety    Hypothyroidism    Hashimoto's   Pure hypercholesterolemia 02/13/2022   SVD (spontaneous vaginal delivery) (12/27) 11/18/2013     Social History   Tobacco Use   Smoking status: Never   Smokeless tobacco: Never  Vaping Use   Vaping status: Never Used  Substance Use Topics   Alcohol use: No   Drug use: No    Past Surgical History:  Procedure Laterality Date   ABDOMINAL HYSTERECTOMY     ABLATION  08/2019   ovarian   CYSTOSCOPY WITH STENT PLACEMENT  N/A 08/05/2020   Procedure: CYSTOSCOPY WITH STENT PLACEMENT, RETROGRADE PYELOGRAM;  Surgeon: Devere Lonni Righter, MD;  Location: [facility];  Service: Urology;  Laterality: N/A;   FLEXIBLE SIGMOIDOSCOPY N/A 08/05/2020   Procedure: RIGID SIGMOIDOSCOPY;  Surgeon: Kandyce Sor, MD;  Location: [facility];  Service: Gynecology;  Laterality: N/A;   LYSIS OF ADHESION  08/05/2020   Procedure: ENTEROLYSIS OF ADHESION;  Surgeon: Kandyce Sor, MD;  Location: [facility];  Service: Gynecology;;   ROBOTIC ASSISTED TOTAL HYSTERECTOMY WITH BILATERAL SALPINGO OOPHERECTOMY N/A 08/05/2020   Procedure: XI ROBOTIC ASSISTED TOTAL HYSTERECTOMY WITH Left SALPINGO OOPHORECTOMY/Right Salpingectomy;  Surgeon: Kandyce Sor, MD;  Location: [facility];  Service: Gynecology;  Laterality: N/A;   WISDOM TOOTH EXTRACTION      Family History  Problem Relation Age of Onset   Diabetes Paternal Grandmother    Cancer Paternal Grandmother        pancreatic cancer   Cancer Paternal Aunt        breast   Colon cancer Neg Hx     No Known Allergies  Current Medications:   Current Outpatient Medications:    buPROPion  (WELLBUTRIN  SR) 200 MG 12 hr tablet, Take 1 tablet (200 mg total) by mouth 2 (two) times daily., Disp: 180 tablet, Rfl: 1   cholecalciferol (VITAMIN D3) 25 MCG (1000 UNIT) tablet, Take 1,000 Units by mouth daily., Disp: , Rfl:    escitalopram  (LEXAPRO ) 10 MG  tablet, Take 1 tablet (10 mg total) by mouth daily., Disp: 90 tablet, Rfl: 3   levothyroxine  (SYNTHROID ) 112 MCG tablet, Take 1 tablet (112 mcg total) by mouth daily before breakfast., Disp: 90 tablet, Rfl: 1   lisdexamfetamine (VYVANSE ) 70 MG capsule, Take 1 capsule (70 mg total) by mouth daily before breakfast., Disp: 30 capsule, Rfl: 0   [START ON 11/24/2024] lisdexamfetamine (VYVANSE ) 70 MG capsule, Take 1 capsule (70 mg total) by mouth daily before breakfast., Disp: 30 capsule, Rfl:  0   [START ON 12/24/2024] lisdexamfetamine (VYVANSE ) 70 MG capsule, Take 1 capsule (70 mg total) by mouth daily before breakfast., Disp: 30 capsule, Rfl: 0   Magnesium Glycinate 100 MG CAPS, Take by mouth., Disp: , Rfl:    amphetamine -dextroamphetamine  (ADDERALL) 10 MG tablet, Take 0.5-2 tablets (5-20 mg total) by mouth daily with lunch as needed, Disp: 60 tablet, Rfl: 0   omeprazole (PRILOSEC) 20 MG capsule, Take 1 capsule (20 mg total) by mouth daily., Disp: 90 capsule, Rfl: 1   ondansetron  (ZOFRAN ) 4 MG tablet, Take 1 tablet (4 mg total) by mouth every 8 (eight) hours as needed for nausea or vomiting., Disp: 30 tablet, Rfl: 1   Review of Systems:   Negative unless otherwise specified per HPI.  Vitals:   Vitals:   10/25/24 0943 10/25/24 1020  BP: (!) 162/100 (!) 140/96  Pulse: 92   Temp: 98 F (36.7 C)   TempSrc: Temporal   SpO2: 98%   Weight: 185 lb 6.4 oz (84.1 kg)   Height: 5' 3 (1.6 m)      Body mass index is 32.84 kg/m.  Physical Exam:   Physical Exam Vitals and nursing note reviewed.  Constitutional:      General: She is not in acute distress.    Appearance: She is well-developed. She is not ill-appearing or toxic-appearing.  Cardiovascular:     Rate and Rhythm: Normal rate and regular rhythm.     Pulses: Normal pulses.     Heart sounds: Normal heart sounds, S1 normal and S2 normal.  Pulmonary:     Effort: Pulmonary effort is normal.     Breath sounds: Normal breath sounds.  Skin:    General: Skin is warm and dry.  Neurological:     Mental Status: She is alert.     GCS: GCS eye subscore is 4. GCS verbal subscore is 5. GCS motor subscore is 6.  Psychiatric:        Speech: Speech normal.        Behavior: Behavior normal. Behavior is cooperative.     Assessment and Plan:   Assessment and Plan    Nausea Persistent nausea possibly due to Wellbutrin  side effects however we are reluctant to change medication after shared decision making. Zofran  provides  symptomatic relief. - Continue Zofran . - Refer to gastroenterology   Rash  Intermittent rash and hives possibly linked to MCAS or allergies. No clear trigger identified. - Refer to allergist for MCAS workup and allergy testing.  Acquired hypothyroidism  Hashimoto's thyroiditis with possible over-replacement indicated by TSH 0.3. Symptoms include fatigue and pain. - Recheck TSH, free T4, and free T3.  Other fatigue  Chronic fatigue and pain possibly related to thyroid  dysfunction or autoimmune conditions. Family history of autoimmune diseases noted. - Order autoimmune panel. - Check iron, ferritin, B12, and vitamin D  levels. - Consider rheumatology referral if autoimmune labs are abnormal.  Elevated blood pressure reading Variable blood pressure readings. - Encourage regular home blood pressure  monitoring.  History of gestational diabetes mellitus  - Recheck A1c.  Follow-up Requires follow-up for ongoing issues and potential specialist referrals. - Follow up with allergist for MCAS and allergy testing. - Follow up with gastroenterology for GI symptoms. - Recheck blood pressure at home and notify us  if blood pressure >130/80 - Schedule lab work for thyroid  function, autoimmune panel, and vitamin levels.         Lucie Buttner, PA-C

## 2024-10-26 ENCOUNTER — Ambulatory Visit: Payer: Self-pay | Admitting: Physician Assistant

## 2024-10-26 LAB — ANA: Anti Nuclear Antibody (ANA): NEGATIVE

## 2024-10-26 LAB — RHEUMATOID FACTOR: Rheumatoid fact SerPl-aCnc: <10 [IU]/mL (ref <14)

## 2024-10-27 ENCOUNTER — Telehealth: Payer: Self-pay | Admitting: Sports Medicine

## 2024-10-27 NOTE — Telephone Encounter (Signed)
 error

## 2024-11-13 ENCOUNTER — Other Ambulatory Visit: Payer: Self-pay | Admitting: Physician Assistant

## 2024-11-21 ENCOUNTER — Other Ambulatory Visit: Payer: Self-pay | Admitting: Physician Assistant

## 2024-11-21 ENCOUNTER — Other Ambulatory Visit (HOSPITAL_BASED_OUTPATIENT_CLINIC_OR_DEPARTMENT_OTHER): Payer: Self-pay

## 2024-11-21 NOTE — Telephone Encounter (Signed)
 Refuse, Rx was sent on 10/25/2024 by provider.

## 2024-11-24 ENCOUNTER — Other Ambulatory Visit (HOSPITAL_BASED_OUTPATIENT_CLINIC_OR_DEPARTMENT_OTHER): Payer: Self-pay

## 2024-12-07 ENCOUNTER — Ambulatory Visit: Payer: Self-pay | Admitting: Sports Medicine

## 2024-12-07 ENCOUNTER — Ambulatory Visit
Admission: RE | Admit: 2024-12-07 | Discharge: 2024-12-07 | Disposition: A | Source: Ambulatory Visit | Attending: Sports Medicine

## 2024-12-07 DIAGNOSIS — M7552 Bursitis of left shoulder: Secondary | ICD-10-CM

## 2024-12-07 DIAGNOSIS — G8929 Other chronic pain: Secondary | ICD-10-CM

## 2024-12-07 DIAGNOSIS — S161XXA Strain of muscle, fascia and tendon at neck level, initial encounter: Secondary | ICD-10-CM

## 2024-12-12 ENCOUNTER — Encounter (INDEPENDENT_AMBULATORY_CARE_PROVIDER_SITE_OTHER): Payer: Self-pay | Admitting: *Deleted

## 2024-12-12 NOTE — Progress Notes (Unsigned)
"             ° °   Crystal Wiley D.[name] [facility] [address] [place] [address] Phone: [phone]   Assessment and Plan:     ***    Pertinent previous records reviewed include ***   Follow Up: ***     Subjective:   I, Crystal Wiley, am serving as a neurosurgeon for [name]  Chief Complaint: L arm pain    HPI:  07/19/2024 Patient is a 49 year old female with L arm pain. Patient states pain for a couple of years. Pain is increases of the past couple of weeks. Decreased ROM. Tylenol  and ibu for the pain do not help. No numbness or tingling. Pain radiates to the neck and back. No decrease in grip strength. Isnt able to lift    08/16/2024 Patient states she is a little better    09/07/2024 Patient states CSI helped for 2 weeks. Pain came back last week. Pain has radiated up to the neck    10/23/2024 Patient states that she is about the same as last visit.   12/13/2024 Patient states   Relevant Historical Information: Hypothyroidism, prediabetes    Additional pertinent review of systems negative.  Current Medications[1]   Objective:     There were no vitals filed for this visit.    There is no height or weight on file to calculate BMI.    Physical Exam:    ***   Electronically signed by:  Odis Mace D.[name] 7:12 AM 12/12/24    [1]  Current Outpatient Medications:    amphetamine -dextroamphetamine  (ADDERALL) 10 MG tablet, Take 0.5-2 tablets (5-20 mg total) by mouth daily with lunch as needed, Disp: 60 tablet, Rfl: 0   buPROPion  (WELLBUTRIN  SR) 200 MG 12 hr tablet, TAKE 1 TABLET TWICE A DAY, Disp: 180 tablet, Rfl: 3   cholecalciferol (VITAMIN D3) 25 MCG (1000 UNIT) tablet, Take 1,000 Units by mouth daily., Disp: , Rfl:    escitalopram  (LEXAPRO ) 10 MG tablet, Take 1 tablet (10 mg total) by mouth daily., Disp: 90 tablet, Rfl: 3   levothyroxine  (SYNTHROID ) 112 MCG tablet, TAKE 1 TABLET DAILY BEFORE  BREAKFAST, Disp: 90 tablet, Rfl: 3   lisdexamfetamine  (VYVANSE ) 70 MG capsule, Take 1 capsule (70 mg total) by mouth daily before breakfast., Disp: 30 capsule, Rfl: 0   lisdexamfetamine  (VYVANSE ) 70 MG capsule, Take 1 capsule (70 mg total) by mouth daily before breakfast., Disp: 30 capsule, Rfl: 0   [START ON 12/24/2024] lisdexamfetamine  (VYVANSE ) 70 MG capsule, Take 1 capsule (70 mg total) by mouth daily before breakfast., Disp: 30 capsule, Rfl: 0   Magnesium Glycinate 100 MG CAPS, Take by mouth., Disp: , Rfl:    omeprazole  (PRILOSEC) 20 MG capsule, Take 1 capsule (20 mg total) by mouth daily., Disp: 90 capsule, Rfl: 1   ondansetron  (ZOFRAN ) 4 MG tablet, Take 1 tablet (4 mg total) by mouth every 8 (eight) hours as needed for nausea or vomiting., Disp: 30 tablet, Rfl: 1  "

## 2024-12-13 ENCOUNTER — Ambulatory Visit: Admitting: Sports Medicine

## 2024-12-13 NOTE — Progress Notes (Unsigned)
 "               [facility] [address] [place] [address] Phone: [phone]   Assessment and Plan:     1. Chronic left shoulder pain (Primary) 2. Partial nontraumatic tear of rotator cuff, left -Chronic with exacerbation, subsequent visit - Continued left shoulder pain consistent with partial tearing of supraspinatus and infraspinatus as seen on MRI - reviewed MRI in clinic with patient at today's visit.  Discussed partial tearing of supraspinatus and infraspinatus tendons, tendinosis seen that explains patient's ongoing shoulder pain resistant to conservative therapy  We have 2 recommended treatment options 1.  Platelet rich plasma (PRP) injection where we draw your own blood, concentrate the healing factors, and inject this into the areas of partial tearing.  Would pare PRP injection with 4 to 6-week physical therapy program 2.  Referral to orthopedic surgery to discuss surgical intervention.  Most likely surgical intervention would be a shoulder arthroscopy.   For pain management: - Use meloxicam  15 mg daily as needed for breakthrough pain.  Recommend limiting chronic NSAIDs to 1-2 doses per week to prevent long-term side effects. Use Tylenol  500 to 1000 mg tablets 2-3 times a day as needed for day-to-day pain relief.    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 Small full-thickness component is suspected in the anterior supraspinatus tendon. No large full-thickness retracted tear is present.   Mild tendinosis at the insertion of the subscapularis tendon.   Likely mild tendinosis of the intra-articular segment of biceps tendon with intact fibers. Labrum is intact.   Follow Up: Patient to contact our clinic to let  us  know how she would like to proceed   Subjective:   I, Crystal Wiley, am serving as a neurosurgeon for [name]  Chief Complaint: L arm pain    HPI:  07/19/2024 Patient is a 49 year old female with L arm pain. Patient states pain for a couple of years. Pain is increases of the past couple of weeks. Decreased ROM. Tylenol  and ibu for the pain do not help. No numbness or tingling. Pain radiates to the neck and back. No decrease in grip strength. Isnt able to lift    08/16/2024 Patient states she is a little better    09/07/2024 Patient states CSI helped for 2 weeks. Pain came back last week. Pain has radiated up to the neck    10/23/2024 Patient states that she is about the same as last visit.   12/14/2024 Patient states she is okay. Pain is intermittent with ROM.   Relevant Historical Information: Hypothyroidism, prediabetes    Additional pertinent review of systems negative.  Current Medications[1]   Objective:     Vitals:   12/14/24 0833  BP: 126/82  Pulse: 83  SpO2: 97%  Weight: 186 lb (84.4 kg)  Height: 5' 3 (1.6 m)      Body mass index is 32.95 kg/m.    Physical Exam:    Gen: Appears well, nad, nontoxic and pleasant Neuro:sensation intact, strength is 5/5 with df/pf/inv/ev, muscle tone wnl Skin: no suspicious lesion or defmority Psych: A&O, appropriate mood and affect   Left shoulder:  No deformity, swelling or muscle wasting No scapular winging FF 180, abd 180 with painful  arc from 90 through 180, int 0, ext 90 NTTP over the [place], clavicle, ac, coracoid, biceps groove, humerus, deltoid, trapezius, cervical spine Positive neer, hawkins, subscap lift off Negative empty can, obriens, crossarm,   speeds Neg ant drawer, sulcus sign, apprehension Negative Spurling's test bilat FROM of neck     Electronically signed by:  Crystal [facility] 8:56 AM 12/14/24     [1]  Current Outpatient Medications:     amphetamine -dextroamphetamine  (ADDERALL) 10 MG tablet, Take 0.5-2 tablets (5-20 mg total) by mouth daily with lunch as needed, Disp: 60 tablet, Rfl: 0   buPROPion  (WELLBUTRIN  SR) 200 MG 12 hr tablet, TAKE 1 TABLET TWICE A DAY, Disp: 180 tablet, Rfl: 3   cholecalciferol (VITAMIN D3) 25 MCG (1000 UNIT) tablet, Take 1,000 Units by mouth daily., Disp: , Rfl:    escitalopram  (LEXAPRO ) 10 MG tablet, Take 1 tablet (10 mg total) by mouth daily., Disp: 90 tablet, Rfl: 3   levothyroxine  (SYNTHROID ) 112 MCG tablet, TAKE 1 TABLET DAILY BEFORE BREAKFAST, Disp: 90 tablet, Rfl: 3   lisdexamfetamine  (VYVANSE ) 70 MG capsule, Take 1 capsule (70 mg total) by mouth daily before breakfast., Disp: 30 capsule, Rfl: 0   lisdexamfetamine  (VYVANSE ) 70 MG capsule, Take 1 capsule (70 mg total) by mouth daily before breakfast., Disp: 30 capsule, Rfl: 0   [START ON 12/24/2024] lisdexamfetamine  (VYVANSE ) 70 MG capsule, Take 1 capsule (70 mg total) by mouth daily before breakfast., Disp: 30 capsule, Rfl: 0   Magnesium Glycinate 100 MG CAPS, Take by mouth., Disp: , Rfl:    omeprazole  (PRILOSEC) 20 MG capsule, Take 1 capsule (20 mg total) by mouth daily., Disp: 90 capsule, Rfl: 1   ondansetron  (ZOFRAN ) 4 MG tablet, Take 1 tablet (4 mg total) by mouth every 8 (eight) hours as needed for nausea or vomiting., Disp: 30 tablet, Rfl: 1  "

## 2024-12-14 ENCOUNTER — Ambulatory Visit: Admitting: Sports Medicine

## 2024-12-14 ENCOUNTER — Other Ambulatory Visit (HOSPITAL_BASED_OUTPATIENT_CLINIC_OR_DEPARTMENT_OTHER): Payer: Self-pay

## 2024-12-14 VITALS — BP 126/82 | HR 83 | Ht 63.0 in | Wt 186.0 lb

## 2024-12-14 DIAGNOSIS — M75112 Incomplete rotator cuff tear or rupture of left shoulder, not specified as traumatic: Secondary | ICD-10-CM | POA: Diagnosis not present

## 2024-12-14 DIAGNOSIS — G8929 Other chronic pain: Secondary | ICD-10-CM

## 2024-12-14 DIAGNOSIS — M25512 Pain in left shoulder: Secondary | ICD-10-CM | POA: Diagnosis not present

## 2024-12-14 MED ORDER — MELOXICAM 15 MG PO TABS
15.0000 mg | ORAL_TABLET | Freq: Every day | ORAL | 0 refills | Status: AC | PRN
  Filled 2024-12-14: qty 30, 30d supply, fill #0

## 2024-12-14 MED ORDER — CYCLOBENZAPRINE HCL 5 MG PO TABS
5.0000 mg | ORAL_TABLET | Freq: Every evening | ORAL | 1 refills | Status: AC | PRN
  Filled 2024-12-14: qty 30, 30d supply, fill #0

## 2024-12-14 MED ORDER — MELOXICAM 15 MG PO TABS
15.0000 mg | ORAL_TABLET | Freq: Every day | ORAL | 0 refills | Status: DC
  Filled 2024-12-14: qty 30, 30d supply, fill #0

## 2024-12-14 NOTE — Patient Instructions (Signed)
 Thank you for coming in today  You have partial tearing and tendinitis of 2 rotator cuff muscles (supraspinatus and infraspinatus) that are likely causing your pain.  We have 2 recommended treatment options 1.  Platelet rich plasma (PRP) injection where we draw your own blood, concentrate the healing factors, and inject this into the areas of partial tearing.  Would pare PRP injection with 4 to 6-week physical therapy program 2.  Referral to orthopedic surgery to discuss surgical intervention.  Most likely surgical intervention would be a shoulder arthroscopy.  Please call our clinic and let us  know how he would like to proceed  For pain management: - Use meloxicam  15 mg daily as needed for breakthrough pain.  Recommend limiting chronic NSAIDs to 1-2 doses per week to prevent long-term side effects. Use Tylenol  500 to 1000 mg tablets 2-3 times a day as needed for day-to-day pain relief.    - Use Flexeril  5 to 10 mg nightly as needed for muscle spasms

## 2024-12-22 ENCOUNTER — Other Ambulatory Visit: Payer: Self-pay | Admitting: Physician Assistant

## 2024-12-22 ENCOUNTER — Other Ambulatory Visit (HOSPITAL_BASED_OUTPATIENT_CLINIC_OR_DEPARTMENT_OTHER): Payer: Self-pay

## 2024-12-22 MED ORDER — AMPHETAMINE-DEXTROAMPHETAMINE 10 MG PO TABS
5.0000 mg | ORAL_TABLET | Freq: Every day | ORAL | 0 refills | Status: AC
  Filled 2024-12-22: qty 60, 30d supply, fill #0

## 2024-12-22 NOTE — Telephone Encounter (Signed)
 Pt requesting refill for Adderall 10 mg tablet. Last OV 10/25/2024. Pt is scheduled for OV 01/01/2025.

## 2024-12-23 ENCOUNTER — Other Ambulatory Visit (HOSPITAL_BASED_OUTPATIENT_CLINIC_OR_DEPARTMENT_OTHER): Payer: Self-pay

## 2024-12-26 ENCOUNTER — Ambulatory Visit: Admitting: Allergy & Immunology

## 2024-12-28 ENCOUNTER — Other Ambulatory Visit (HOSPITAL_BASED_OUTPATIENT_CLINIC_OR_DEPARTMENT_OTHER): Payer: Self-pay

## 2025-01-01 ENCOUNTER — Encounter: Admitting: Physician Assistant

## 2025-01-15 ENCOUNTER — Ambulatory Visit: Admitting: Internal Medicine

## 2025-01-16 ENCOUNTER — Ambulatory Visit: Admitting: Allergy & Immunology

## 2025-02-06 ENCOUNTER — Ambulatory Visit: Admitting: Allergy & Immunology
